# Patient Record
Sex: Male | Born: 1985 | Race: Black or African American | Hispanic: No | Marital: Single | Smoking: Never smoker
Health system: Southern US, Community
[De-identification: ages and names within clinical notes are randomized; demographics above are authoritative.]

## PROBLEM LIST (undated history)

## (undated) DIAGNOSIS — J45909 Unspecified asthma, uncomplicated: Secondary | ICD-10-CM

---
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
## 2006-12-18 ENCOUNTER — Emergency Department (HOSPITAL_COMMUNITY): Admission: EM | Admit: 2006-12-18 | Discharge: 2006-12-18 | Payer: Self-pay | Admitting: Family Medicine

## 2007-04-16 ENCOUNTER — Emergency Department (HOSPITAL_COMMUNITY): Admission: EM | Admit: 2007-04-16 | Discharge: 2007-04-16 | Payer: Self-pay | Admitting: Emergency Medicine

## 2009-06-09 ENCOUNTER — Emergency Department (HOSPITAL_COMMUNITY): Admission: EM | Admit: 2009-06-09 | Discharge: 2009-06-09 | Payer: Self-pay | Admitting: Family Medicine

## 2009-10-28 ENCOUNTER — Emergency Department (HOSPITAL_COMMUNITY): Admission: EM | Admit: 2009-10-28 | Discharge: 2009-10-29 | Payer: Self-pay | Admitting: Emergency Medicine

## 2009-11-11 ENCOUNTER — Emergency Department (HOSPITAL_COMMUNITY): Admission: EM | Admit: 2009-11-11 | Discharge: 2009-11-11 | Payer: Self-pay | Admitting: Family Medicine

## 2010-01-26 ENCOUNTER — Emergency Department (HOSPITAL_COMMUNITY): Admission: EM | Admit: 2010-01-26 | Discharge: 2010-01-26 | Payer: Self-pay | Admitting: Emergency Medicine

## 2010-06-03 ENCOUNTER — Emergency Department (HOSPITAL_COMMUNITY): Admission: EM | Admit: 2010-06-03 | Discharge: 2010-06-03 | Payer: Self-pay | Admitting: Family Medicine

## 2010-06-20 ENCOUNTER — Emergency Department (HOSPITAL_COMMUNITY): Admission: EM | Admit: 2010-06-20 | Discharge: 2010-06-20 | Payer: Self-pay | Admitting: Emergency Medicine

## 2011-05-05 LAB — DIFFERENTIAL
Basophils Absolute: 0
Basophils Relative: 0
Eosinophils Absolute: 0.1
Eosinophils Relative: 2
Lymphs Abs: 1.6
Monocytes Absolute: 0.3
Monocytes Relative: 4
Neutro Abs: 5
Neutrophils Relative %: 70

## 2011-05-05 LAB — CBC
Platelets: 226
RDW: 13.6

## 2011-05-05 LAB — ABO/RH: ABO/RH(D): O NEG

## 2011-05-05 LAB — POCT I-STAT CREATININE: Creatinine, Ser: 1.3

## 2013-11-13 ENCOUNTER — Encounter (HOSPITAL_COMMUNITY): Payer: Self-pay | Admitting: Emergency Medicine

## 2013-11-13 ENCOUNTER — Emergency Department (INDEPENDENT_AMBULATORY_CARE_PROVIDER_SITE_OTHER)
Admission: EM | Admit: 2013-11-13 | Discharge: 2013-11-13 | Disposition: A | Discharge status: Home / Self Care | Payer: Self-pay | Source: Home / Self Care | Attending: Family Medicine | Admitting: Family Medicine

## 2013-11-13 DIAGNOSIS — J40 Bronchitis, not specified as acute or chronic: Secondary | ICD-10-CM

## 2013-11-13 MED ORDER — IPRATROPIUM-ALBUTEROL 0.5-2.5 (3) MG/3ML IN SOLN
3.0000 mL | Freq: Once | RESPIRATORY_TRACT | Status: AC
  Administered 2013-11-13: 3 mL via RESPIRATORY_TRACT

## 2013-11-13 MED ORDER — ALBUTEROL SULFATE HFA 108 (90 BASE) MCG/ACT IN AERS: 2.0000 | INHALATION_SPRAY | Freq: Four times a day (QID) | RESPIRATORY_TRACT | Status: DC | PRN

## 2013-11-13 MED ORDER — FLUTICASONE PROPIONATE 50 MCG/ACT NA SUSP: 2.0000 | Freq: Every day | NASAL | Status: AC

## 2013-11-13 MED ORDER — IPRATROPIUM-ALBUTEROL 0.5-2.5 (3) MG/3ML IN SOLN
RESPIRATORY_TRACT | Status: AC
  Filled 2013-11-13: qty 3

## 2013-11-13 MED ORDER — PREDNISONE 10 MG PO TABS: 30.0000 mg | ORAL_TABLET | Freq: Every day | ORAL | Status: DC

## 2013-11-13 NOTE — Discharge Instructions (Signed)
Thank you for coming in today. °Call or go to the emergency room if you get worse, have trouble breathing, have chest pains, or palpitations.  ° °Bronchitis °Bronchitis is inflammation of the airways that extend from the windpipe into the lungs (bronchi). The inflammation often causes mucus to develop, which leads to a cough. If the inflammation becomes severe, it may cause shortness of breath. °CAUSES  °Bronchitis may be caused by:  °· Viral infections.   °· Bacteria.   °· Cigarette smoke.   °· Allergens, pollutants, and other irritants.   °SIGNS AND SYMPTOMS  °The most common symptom of bronchitis is a frequent cough that produces mucus. Other symptoms include: °· Fever.   °· Body aches.   °· Chest congestion.   °· Chills.   °· Shortness of breath.   °· Sore throat.   °DIAGNOSIS  °Bronchitis is usually diagnosed through a medical history and physical exam. Tests, such as chest X-rays, are sometimes done to rule out other conditions.  °TREATMENT  °You may need to avoid contact with whatever caused the problem (smoking, for example). Medicines are sometimes needed. These may include: °· Antibiotics. These may be prescribed if the condition is caused by bacteria. °· Cough suppressants. These may be prescribed for relief of cough symptoms.   °· Inhaled medicines. These may be prescribed to help open your airways and make it easier for you to breathe.   °· Steroid medicines. These may be prescribed for those with recurrent (chronic) bronchitis. °HOME CARE INSTRUCTIONS °· Get plenty of rest.   °· Drink enough fluids to keep your urine clear or pale yellow (unless you have a medical condition that requires fluid restriction). Increasing fluids may help thin your secretions and will prevent dehydration.   °· Only take over-the-counter or prescription medicines as directed by your health care provider. °· Only take antibiotics as directed. Make sure you finish them even if you start to feel better. °· Avoid secondhand  smoke, irritating chemicals, and strong fumes. These will make bronchitis worse. If you are a smoker, quit smoking. Consider using nicotine gum or skin patches to help control withdrawal symptoms. Quitting smoking will help your lungs heal faster.   °· Put a cool-mist humidifier in your bedroom at night to moisten the air. This may help loosen mucus. Change the water in the humidifier daily. You can also run the hot water in your shower and sit in the bathroom with the door closed for 5 10 minutes.   °· Follow up with your health care provider as directed.   °· Wash your hands frequently to avoid catching bronchitis again or spreading an infection to others.   °SEEK MEDICAL CARE IF: °Your symptoms do not improve after 1 week of treatment.  °SEEK IMMEDIATE MEDICAL CARE IF: °· Your fever increases. °· You have chills.   °· You have chest pain.   °· You have worsening shortness of breath.   °· You have bloody sputum. °· You faint.   °· You have lightheadedness. °· You have a severe headache.   °· You vomit repeatedly. °MAKE SURE YOU:  °· Understand these instructions. °· Will watch your condition. °· Will get help right away if you are not doing well or get worse. °Document Released: 07/11/2005 Document Revised: 05/01/2013 Document Reviewed: 03/05/2013 °Exit[employer]® Patient Information ©2014 [employer]. ° °

## 2013-11-13 NOTE — ED Notes (Signed)
Pt c/o cold sx onset 2 days Sx include: runny nose, chest congestion, HA, productive cough, SOB Hx of asthma Has tried Mucinex and used his albuterol w/no relief.

## 2013-11-13 NOTE — ED Provider Notes (Signed)
George BucyMichael J Mcaulay is a 28 y.o. male who presents to Urgent Care today for cough congestion runny nose associated with mild shortness of breath. Symptoms present for 2 days. Patient has tried Mucinex which has helped a bit. Albuterol has not helped much. No fevers chills nausea vomiting or diarrhea.   History reviewed. No pertinent past medical history. History  Substance Use Topics  . Smoking status: Never Smoker   . Smokeless tobacco: Not on file  . Alcohol Use: No   ROS as above Medications: No current facility-administered medications for this encounter.   Current Outpatient Prescriptions  Medication Sig Dispense Refill  . albuterol (PROVENTIL HFA;VENTOLIN HFA) 108 (90 BASE) MCG/ACT inhaler Inhale 2 puffs into the lungs every 6 (six) hours as needed for wheezing or shortness of breath.  1 Inhaler  2  . fluticasone (FLONASE) 50 MCG/ACT nasal spray Place 2 sprays into both nostrils daily.  16 g  2  . predniSONE (DELTASONE) 10 MG tablet Take 3 tablets (30 mg total) by mouth daily.  15 tablet  0    Exam:  BP 128/79  Pulse 58  Temp(Src) 99 F (37.2 C) (Oral)  Resp 18  SpO2 98% Gen: Well NAD HEENT: EOMI,  MMM posterior pharynx cobblestoning. Normal tympanic membranes bilaterally. Lungs: Normal work of breathing. Wheezing present Heart: RRR no MRG Abd: NABS, Soft. NT, ND Exts: Brisk capillary refill, warm and well perfused.   Patient was given a DuoNeb nebulizer treatment and felt much better.  No results found for this or any previous visit (from the past 24 hour(s)). No results found.  Assessment and Plan: 28 y.o. male with bronchitis. Likely due to allergies. Plan to treat with prednisone, albuterol and oral antihistamines.  Discussed warning signs or symptoms. Please see discharge instructions. Patient expresses understanding.    Rodolph BongEvan S Aviv Rota, MD 11/13/13 [phone]

## 2014-11-28 ENCOUNTER — Emergency Department (HOSPITAL_COMMUNITY)
Admission: EM | Admit: 2014-11-28 | Discharge: 2014-11-28 | Disposition: A | Discharge status: Home / Self Care | Payer: No Typology Code available for payment source | Attending: Emergency Medicine | Admitting: Emergency Medicine

## 2014-11-28 ENCOUNTER — Encounter (HOSPITAL_COMMUNITY): Payer: Self-pay | Admitting: Emergency Medicine

## 2014-11-28 ENCOUNTER — Emergency Department (HOSPITAL_COMMUNITY): Payer: No Typology Code available for payment source

## 2014-11-28 DIAGNOSIS — S161XXA Strain of muscle, fascia and tendon at neck level, initial encounter: Secondary | ICD-10-CM | POA: Diagnosis not present

## 2014-11-28 DIAGNOSIS — Z7951 Long term (current) use of inhaled steroids: Secondary | ICD-10-CM | POA: Diagnosis not present

## 2014-11-28 DIAGNOSIS — S8001XA Contusion of right knee, initial encounter: Secondary | ICD-10-CM | POA: Insufficient documentation

## 2014-11-28 DIAGNOSIS — Y998 Other external cause status: Secondary | ICD-10-CM | POA: Insufficient documentation

## 2014-11-28 DIAGNOSIS — S40011A Contusion of right shoulder, initial encounter: Secondary | ICD-10-CM | POA: Insufficient documentation

## 2014-11-28 DIAGNOSIS — S3992XA Unspecified injury of lower back, initial encounter: Secondary | ICD-10-CM | POA: Insufficient documentation

## 2014-11-28 DIAGNOSIS — S199XXA Unspecified injury of neck, initial encounter: Secondary | ICD-10-CM | POA: Diagnosis present

## 2014-11-28 DIAGNOSIS — J45909 Unspecified asthma, uncomplicated: Secondary | ICD-10-CM | POA: Diagnosis not present

## 2014-11-28 DIAGNOSIS — Y92410 Unspecified street and highway as the place of occurrence of the external cause: Secondary | ICD-10-CM | POA: Diagnosis not present

## 2014-11-28 DIAGNOSIS — Z79899 Other long term (current) drug therapy: Secondary | ICD-10-CM | POA: Diagnosis not present

## 2014-11-28 DIAGNOSIS — Y9389 Activity, other specified: Secondary | ICD-10-CM | POA: Diagnosis not present

## 2014-11-28 HISTORY — DX: Unspecified asthma, uncomplicated: J45.909

## 2014-11-28 MED ORDER — OXYCODONE-ACETAMINOPHEN 5-325 MG PO TABS
2.0000 | ORAL_TABLET | Freq: Once | ORAL | Status: AC
  Administered 2014-11-28: 2 via ORAL
  Filled 2014-11-28: qty 2
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
## 2014-11-28 NOTE — ED Notes (Signed)
Patient on EMS stretcher moving head from side to side while adjusting C-collar-reinforcement to patient to keep head and neck still
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
## 2014-11-28 NOTE — Discharge Instructions (Signed)
Cervical Sprain °A cervical sprain is when the tissues (ligaments) that hold the neck bones in place stretch or tear. °HOME CARE  °· Put ice on the injured area. °¨ Put ice in a plastic bag. °¨ Place a towel between your skin and the bag. °¨ Leave the ice on for 15-20 minutes, 3-4 times a day. °· You may have been given a collar to wear. This collar keeps your neck from moving while you heal. °¨ Do not take the collar off unless told by your doctor. °¨ If you have long hair, keep it outside of the collar. °¨ Ask your doctor before changing the position of your collar. You may need to change its position over time to make it more comfortable. °¨ If you are allowed to take off the collar for cleaning or bathing, follow your doctor's instructions on how to do it safely. °¨ Keep your collar clean by wiping it with mild soap and water. Dry it completely. If the collar has removable pads, remove them every 1-2 days to hand wash them with soap and water. Allow them to air dry. They should be dry before you wear them in the collar. °¨ Do not drive while wearing the collar. °· Only take medicine as told by your doctor. °· Keep all doctor visits as told. °· Keep all physical therapy visits as told. °· Adjust your work station so that you have good posture while you work. °· Avoid positions and activities that make your problems worse. °· Warm up and stretch before being active. °GET HELP IF: °· Your pain is not controlled with medicine. °· You cannot take less pain medicine over time as planned. °· Your activity level does not improve as expected. °GET HELP RIGHT AWAY IF:  °· You are bleeding. °· Your stomach is upset. °· You have an allergic reaction to your medicine. °· You develop new problems that you cannot explain. °· You lose feeling (become numb) or you cannot move any part of your body (paralysis). °· You have tingling or weakness in any part of your body. °· Your symptoms get worse. Symptoms include: °· Pain,  soreness, stiffness, puffiness (swelling), or a burning feeling in your neck. °· Pain when your neck is touched. °· Shoulder or upper back pain. °· Limited ability to move your neck. °· Headache. °· Dizziness. °· Your hands or arms feel week, lose feeling, or tingle. °· Muscle spasms. °· Difficulty swallowing or chewing. °MAKE SURE YOU:  °· Understand these instructions. °· Will watch your condition. °· Will get help right away if you are not doing well or get worse. °Document Released: 12/28/2007 Document Revised: 03/13/2013 Document Reviewed: 01/16/2013 °[employer]® Patient Information ©2015 [employer]. This information is not intended to replace advice given to you by your health care provider. Make sure you discuss any questions you have with your health care provider. ° °Motor Vehicle Collision °After a car crash (motor vehicle collision), it is normal to have bruises and sore muscles. The first 24 hours usually feel the worst. After that, you will likely start to feel better each day. °HOME CARE °· Put ice on the injured area. °¨ Put ice in a plastic bag. °¨ Place a towel between your skin and the bag. °¨ Leave the ice on for 15-20 minutes, 03-04 times a day. °· Drink enough fluids to keep your pee (urine) clear or pale yellow. °· Do not drink alcohol. °· Take a warm shower or bath 1 or 2   times a day. This helps your sore muscles. °· Return to activities as told by your doctor. Be careful when lifting. Lifting can make neck or back pain worse. °· Only take medicine as told by your doctor. Do not use aspirin. °GET HELP RIGHT AWAY IF:  °· Your arms or legs tingle, feel weak, or lose feeling (numbness). °· You have headaches that do not get better with medicine. °· You have neck pain, especially in the middle of the back of your neck. °· You cannot control when you pee (urinate) or poop (bowel movement). °· Pain is getting worse in any part of your body. °· You are short of breath, dizzy, or pass out  (faint). °· You have chest pain. °· You feel sick to your stomach (nauseous), throw up (vomit), or sweat. °· You have belly (abdominal) pain that gets worse. °· There is blood in your pee, poop, or throw up. °· You have pain in your shoulder (shoulder strap areas). °· Your problems are getting worse. °MAKE SURE YOU:  °· Understand these instructions. °· Will watch your condition. °· Will get help right away if you are not doing well or get worse. °Document Released: 12/28/2007 Document Revised: 10/03/2011 Document Reviewed: 12/08/2010 °[employer]® Patient Information ©2015 [employer]. This information is not intended to replace advice given to you by your health care provider. Make sure you discuss any questions you have with your health care provider. ° °

## 2014-11-28 NOTE — ED Notes (Signed)
Pt moved to Recess A for assessment per MD request
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
## 2017-02-23 ENCOUNTER — Encounter (HOSPITAL_COMMUNITY): Payer: Self-pay | Admitting: Emergency Medicine

## 2017-02-23 ENCOUNTER — Emergency Department (HOSPITAL_COMMUNITY)
Admission: EM | Admit: 2017-02-23 | Discharge: 2017-02-24 | Disposition: A | Discharge status: Home / Self Care | Payer: Self-pay | Attending: Emergency Medicine | Admitting: Emergency Medicine

## 2017-02-23 DIAGNOSIS — J45909 Unspecified asthma, uncomplicated: Secondary | ICD-10-CM | POA: Insufficient documentation

## 2017-02-23 DIAGNOSIS — N200 Calculus of kidney: Secondary | ICD-10-CM | POA: Insufficient documentation

## 2017-02-23 DIAGNOSIS — R52 Pain, unspecified: Secondary | ICD-10-CM

## 2017-02-23 NOTE — ED Notes (Signed)
Bed: WA16 Expected date:  Expected time:  Means of arrival:  Comments: abd pain 

## 2017-02-23 NOTE — ED Triage Notes (Signed)
Pt from home via EMS.  Reports eating several types of fast food and cheese for last several days.  Reports RUQ/RLQ sharp abd pain after eating [employer] for dinner.  A&Ox4.  Denies D/C or vomiting, some nausea present.  Pt shouting out and rolling around in bed. VS: 142/78, 60 bpm, 174 CBG, 20 RR

## 2017-02-24 ENCOUNTER — Emergency Department (HOSPITAL_COMMUNITY): Payer: Self-pay

## 2017-02-24 ENCOUNTER — Encounter (HOSPITAL_COMMUNITY): Payer: Self-pay

## 2017-02-24 LAB — COMPREHENSIVE METABOLIC PANEL
ALK PHOS: 42 U/L (ref 38–126)
ALT: 23 U/L (ref 17–63)
AST: 23 U/L (ref 15–41)
Albumin: 4.4 g/dL (ref 3.5–5.0)
Anion gap: 9 (ref 5–15)
BILIRUBIN TOTAL: 0.6 mg/dL (ref 0.3–1.2)
BUN: 17 mg/dL (ref 6–20)
CALCIUM: 9.1 mg/dL (ref 8.9–10.3)
CHLORIDE: 106 mmol/L (ref 101–111)
CO2: 27 mmol/L (ref 22–32)
CREATININE: 1.27 mg/dL — AB (ref 0.61–1.24)
Glucose, Bld: 152 mg/dL — ABNORMAL HIGH (ref 65–99)
Potassium: 3.3 mmol/L — ABNORMAL LOW (ref 3.5–5.1)
Sodium: 142 mmol/L (ref 135–145)
TOTAL PROTEIN: 7.2 g/dL (ref 6.5–8.1)

## 2017-02-24 LAB — CBC
HCT: 37 % — ABNORMAL LOW (ref 39.0–52.0)
Hemoglobin: 12.5 g/dL — ABNORMAL LOW (ref 13.0–17.0)
MCH: 29.3 pg (ref 26.0–34.0)
MCHC: 33.8 g/dL (ref 30.0–36.0)
MCV: 86.7 fL (ref 78.0–100.0)
Platelets: 199 10*3/uL (ref 150–400)
RBC: 4.27 MIL/uL (ref 4.22–5.81)
RDW: 14.2 % (ref 11.5–15.5)
WBC: 11.5 10*3/uL — AB (ref 4.0–10.5)

## 2017-02-24 LAB — LIPASE, BLOOD: Lipase: 15 U/L (ref 11–51)

## 2017-02-24 LAB — URINALYSIS, ROUTINE W REFLEX MICROSCOPIC
BILIRUBIN URINE: NEGATIVE
GLUCOSE, UA: NEGATIVE mg/dL
HGB URINE DIPSTICK: NEGATIVE
KETONES UR: 5 mg/dL — AB
Leukocytes, UA: NEGATIVE
Nitrite: NEGATIVE
PH: 5 (ref 5.0–8.0)
Protein, ur: NEGATIVE mg/dL
SPECIFIC GRAVITY, URINE: 1.027 (ref 1.005–1.030)

## 2017-02-24 MED ORDER — GI COCKTAIL ~~LOC~~
30.0000 mL | Freq: Once | ORAL | Status: AC
  Administered 2017-02-24: 30 mL via ORAL
  Filled 2017-02-24: qty 30

## 2017-02-24 MED ORDER — ONDANSETRON HCL 4 MG PO TABS: 4.0000 mg | ORAL_TABLET | Freq: Four times a day (QID) | ORAL | 0 refills | Status: DC

## 2017-02-24 MED ORDER — METOCLOPRAMIDE HCL 5 MG/ML IJ SOLN
10.0000 mg | Freq: Once | INTRAMUSCULAR | Status: AC
  Administered 2017-02-24: 10 mg via INTRAVENOUS
  Filled 2017-02-24: qty 2

## 2017-02-24 MED ORDER — IOPAMIDOL (ISOVUE-300) INJECTION 61%
100.0000 mL | Freq: Once | INTRAVENOUS | Status: AC | PRN
  Administered 2017-02-24: 100 mL via INTRAVENOUS

## 2017-02-24 MED ORDER — HYDROCODONE-ACETAMINOPHEN 5-325 MG PO TABS: 1.0000 | ORAL_TABLET | ORAL | 0 refills | Status: DC | PRN

## 2017-02-24 MED ORDER — POTASSIUM CHLORIDE CRYS ER 20 MEQ PO TBCR
40.0000 meq | EXTENDED_RELEASE_TABLET | Freq: Once | ORAL | Status: AC
Start: 2017-02-24 — End: 2017-02-24
  Administered 2017-02-24: 40 meq via ORAL
  Filled 2017-02-24: qty 2

## 2017-02-24 MED ORDER — SODIUM CHLORIDE 0.9 % IV BOLUS (SEPSIS)
1000.0000 mL | Freq: Once | INTRAVENOUS | Status: AC
  Administered 2017-02-24: 1000 mL via INTRAVENOUS

## 2017-02-24 MED ORDER — DICYCLOMINE HCL 10 MG/ML IM SOLN
20.0000 mg | Freq: Once | INTRAMUSCULAR | Status: AC
  Administered 2017-02-24: 20 mg via INTRAMUSCULAR
  Filled 2017-02-24: qty 2

## 2017-02-24 MED ORDER — KETOROLAC TROMETHAMINE 30 MG/ML IJ SOLN
15.0000 mg | Freq: Once | INTRAMUSCULAR | Status: AC
  Administered 2017-02-24: 15 mg via INTRAVENOUS
  Filled 2017-02-24: qty 1

## 2017-02-24 MED ORDER — TAMSULOSIN HCL 0.4 MG PO CAPS: 0.4000 mg | ORAL_CAPSULE | Freq: Every day | ORAL | 0 refills | Status: DC

## 2017-02-24 MED ORDER — MORPHINE SULFATE (PF) 4 MG/ML IV SOLN
6.0000 mg | Freq: Once | INTRAVENOUS | Status: AC
  Administered 2017-02-24: 6 mg via INTRAVENOUS
  Filled 2017-02-24: qty 2

## 2017-02-24 MED ORDER — IOPAMIDOL (ISOVUE-300) INJECTION 61%
INTRAVENOUS | Status: AC
  Filled 2017-02-24: qty 100

## 2017-02-24 MED ORDER — MORPHINE SULFATE (PF) 4 MG/ML IV SOLN
4.0000 mg | Freq: Once | INTRAVENOUS | Status: DC
  Filled 2017-02-24: qty 1

## 2017-02-24 NOTE — ED Provider Notes (Signed)
[facility] Provider Note   CSN: [id number] Arrival date & time: 02/23/17  2336     History   Chief Complaint Chief Complaint  Patient presents with  . Abdominal Pain    HPI George Coleman is a 31 y.o. male.  HPI 31 year old African-American male with no significant past medical history presents to the emergency department today with vague complaints of right sided abdominal pain, nausea, emesis. Patient states that his pain started this afternoon after eating [employer], spicy [place] food, spicy wings, soda today. No history of same. Reports several episodes of nonbloody nonbilious emesis. Denies any diarrhea. States the pain is constant and describes it as a cramping pain. Rates his pain a 10 out of 10. Patient has not tried anything for his symptoms prior to arrival. Nothing makes better or worse. He denies any fever, chills, urinary symptoms, change in bowel habits, melena, hematochezia, chest pain, shortness of breath. Past Medical History:  Diagnosis Date  . Asthma     There are no active problems to display for this patient.   History reviewed. No pertinent surgical history.     Home Medications    Prior to Admission medications   Medication Sig Start Date End Date Taking? Authorizing Provider  albuterol (PROVENTIL HFA;VENTOLIN HFA) 108 (90 BASE) MCG/ACT inhaler Inhale 2 puffs into the lungs every 6 (six) hours as needed for wheezing or shortness of breath. 11/13/13   Rodolph Bong, MD  fluticasone (FLONASE) 50 MCG/ACT nasal spray Place 2 sprays into both nostrils daily. Patient taking differently: Place 2 sprays into both nostrils daily as needed for allergies.  11/13/13   Rodolph Bong, MD  HYDROcodone-acetaminophen (NORCO/VICODIN) 5-325 MG tablet Take 1-2 tablets by mouth every 4 (four) hours as needed. 02/24/17   Demetrios Loll T, PA-C  ondansetron (ZOFRAN) 4 MG tablet Take 1 tablet (4 mg total) by mouth every 6 (six) hours. 02/24/17   Rise Mu, PA-C    tamsulosin (FLOMAX) 0.4 MG CAPS capsule Take 1 capsule (0.4 mg total) by mouth daily. 02/24/17   Rise Mu, PA-C    Family History No family history on file.  Social History Social History  Substance Use Topics  . Smoking status: Never Smoker  . Smokeless tobacco: Not on file  . Alcohol use No     Allergies   Patient has no known allergies.   Review of Systems Review of Systems  Constitutional: Negative for chills and fever.  HENT: Negative for congestion and sore throat.   Eyes: Negative for visual disturbance.  Respiratory: Negative for cough and shortness of breath.   Cardiovascular: Negative for chest pain.  Gastrointestinal: Positive for abdominal pain, nausea and vomiting. Negative for diarrhea.  Genitourinary: Negative for dysuria, flank pain, frequency, hematuria, scrotal swelling, testicular pain and urgency.  Musculoskeletal: Negative for arthralgias and myalgias.  Skin: Negative for rash.  Neurological: Negative for dizziness, syncope, weakness, light-headedness, numbness and headaches.  Psychiatric/Behavioral: Negative for sleep disturbance. The patient is not nervous/anxious.      Physical Exam Updated Vital Signs BP (!) 133/91   Pulse 87   Temp (!) 97.5 F (36.4 C) (Oral)   Resp 18   SpO2 100%   Physical Exam  Constitutional: He is oriented to person, place, and time. He appears well-developed and well-nourished.  Non-toxic appearance. He appears distressed.  Patient appears very uncomfortable due to pain and pacing in the room.  HENT:  Head: Normocephalic and atraumatic.  Mouth/Throat: Oropharynx is clear  and moist.  Eyes: Pupils are equal, round, and reactive to light. Conjunctivae are normal. Right eye exhibits no discharge. Left eye exhibits no discharge.  Neck: Normal range of motion. Neck supple.  Cardiovascular: Normal rate, regular rhythm, normal heart sounds and intact distal pulses.  Exam reveals no gallop and no friction rub.    No murmur heard. Pulmonary/Chest: Effort normal and breath sounds normal. No respiratory distress. He has no wheezes. He has no rales. He exhibits no tenderness.  Abdominal: Soft. Bowel sounds are normal. He exhibits no distension. There is generalized tenderness and tenderness in the right upper quadrant and epigastric area. There is no rigidity, no rebound, no guarding, no CVA tenderness, no tenderness at McBurney's point and negative Mares's sign.  Musculoskeletal: Normal range of motion. He exhibits no tenderness.  Lymphadenopathy:    He has no cervical adenopathy.  Neurological: He is alert and oriented to person, place, and time.  Skin: Skin is warm and dry. Capillary refill takes less than 2 seconds. No rash noted.  Psychiatric: His behavior is normal. Judgment and thought content normal.  Nursing note and vitals reviewed.    ED Treatments / Results  Labs (all labs ordered are listed, but only abnormal results are displayed) Labs Reviewed  COMPREHENSIVE METABOLIC PANEL - Abnormal; Notable for the following:       Result Value   Potassium 3.3 (*)    Glucose, Bld 152 (*)    Creatinine, Ser 1.27 (*)    All other components within normal limits  CBC - Abnormal; Notable for the following:    WBC 11.5 (*)    Hemoglobin 12.5 (*)    HCT 37.0 (*)    All other components within normal limits  URINALYSIS, ROUTINE W REFLEX MICROSCOPIC - Abnormal; Notable for the following:    Ketones, ur 5 (*)    All other components within normal limits  LIPASE, BLOOD    EKG  EKG Interpretation None       Radiology Ct Abdomen Pelvis W Contrast  Result Date: 02/24/2017 CLINICAL DATA:  Initial evaluation for acute right upper and lower quadrant abdominal pain. EXAM: CT ABDOMEN AND PELVIS WITH CONTRAST TECHNIQUE: Multidetector CT imaging of the abdomen and pelvis was performed using the standard protocol following bolus administration of intravenous contrast. CONTRAST:  100 cc of Isovue-300.  COMPARISON:  Prior ultrasound from earlier the same day. FINDINGS: Lower chest: Visualized lung bases are clear. Hepatobiliary: Liver demonstrates a normal contrast enhanced appearance. Focal fat deposition noted adjacent to the fissure for ligamentum teres. Gallbladder within normal limits. No biliary dilatation. Pancreas: Pancreas within normal limits. Spleen: Spleen within normal limits. Adrenals/Urinary Tract: Adrenal glands are normal. Slightly delayed nephrogram within the right kidney with mild right hydroureteronephrosis. There is an obstructive 3 mm stone positioned at the right UVJ (series 2, image 74). No other calculi seen along the course of the right ureter. Additional 3 mm nonobstructive stone present within the lower pole the right kidney. Milder right perinephric and periureteral fat stranding. On the left, there is moderate left hydronephrosis to the level of the left UPJ. Left ureter is decompressed distally. No obstructive stone identified. Finding may reflect sequelae of underlying stricture. Underlying mass not entirely excluded. 8 mm nonobstructive stone present within the lower pole left kidney. No focal enhancing renal mass. Bladder largely decompressed without acute additional abnormality. Stomach/Bowel: Stomach within normal limits. No evidence for bowel obstruction. Appendix normal. No acute inflammatory changes seen about the bowels. Vascular/Lymphatic: Normal intravascular  enhancement seen throughout the intra-abdominal aorta and its branch vessels. No adenopathy. Reproductive: Prostate within normal limits. Other: No free air or fluid. Musculoskeletal: No acute osseous abnormality. No worrisome lytic or blastic osseous lesions. IMPRESSION: 1. 3 mm obstructive stone at the right UVJ with secondary mild right hydroureteronephrosis. 2. Additional bilateral nonobstructive nephrolithiasis as above. 3. Moderate left hydronephrosis to the level of the left UVJ. No obstructive stone  identified. Underlying stricture suspected, although obstructive mass lesion not entirely excluded. Nonemergent urologic referral suggested. 4. No other acute intra-abdominal or pelvic process. Electronically Signed   By: Rise MuBenjamin  McClintock M.D.   On: 02/24/2017 02:35   [place] Abdomen Limited Ruq  Result Date: 02/24/2017 CLINICAL DATA:  31 year old male with abdominal pain. EXAM: ULTRASOUND ABDOMEN LIMITED RIGHT UPPER QUADRANT COMPARISON:  None. FINDINGS: Gallbladder: No gallstones or wall thickening visualized. No sonographic Stallman sign noted by sonographer. Common bile duct: Diameter: 2 mm Liver: No focal lesion identified. Within normal limits in parenchymal echogenicity. IMPRESSION: Unremarkable right upper quadrant ultrasound. Electronically Signed   By: Elgie CollardArash  Radparvar M.D.   On: 02/24/2017 01:18    Procedures Procedures (including critical care time)  Medications Ordered in ED Medications  iopamidol (ISOVUE-300) 61 % injection (not administered)  metoCLOPramide (REGLAN) injection 10 mg (10 mg Intravenous Given 02/24/17 0024)  sodium chloride 0.9 % bolus 1,000 mL (0 mLs Intravenous Stopped 02/24/17 0258)  gi cocktail (Maalox,Lidocaine,Donnatal) (30 mLs Oral Given 02/24/17 0024)  dicyclomine (BENTYL) injection 20 mg (20 mg Intramuscular Given 02/24/17 0024)  iopamidol (ISOVUE-300) 61 % injection 100 mL (100 mLs Intravenous Contrast Given 02/24/17 0211)  ketorolac (TORADOL) 30 MG/ML injection 15 mg (15 mg Intravenous Given 02/24/17 0257)  morphine 4 MG/ML injection 6 mg (6 mg Intravenous Given 02/24/17 0258)     Initial Impression / Assessment and Plan / ED Course  I have reviewed the triage vital signs and the nursing notes.  Pertinent labs & imaging results that were available during my care of the patient were reviewed by me and considered in my medical decision making (see chart for details).     Patient presents to the ED with a complaint of right-sided abdominal pain after eating food today.  Associated with nausea, emesis. Denies any other associated symptoms of fever, diarrhea. Patient looks uncomfortable due to pain in the room. Vital signs are reassuring. Patient is afebrile, no tachycardia, no hypotension.   Patient does have some right upper quadrant and right lower quadrant abdominal pain to palpation. All other physical exam findings are unremarkable.  Lab results were reviewed. They are reassuring. Mild leukocytosis of 11,000. Hemoglobin is at baseline. Mild hypokalemia 3.3 that was replaced orally. Creatinine is at baseline. UA shows no signs of infection. Lipase is normal. Ultrasound was obtained due to the right upper quadrant pain to rule out any gallbladder abnormalities. This was unremarkable.  Patient remained in pain despite pain medication and to treat likely gastroenteritis. CT scan returned with findings of a 3 mm kidney stone at the UPJ with some mild hydronephrosis of the left side likely due to stricture but cannot rule out obstruction. Patient is afebrile. UA shows no signs of infection. Pain was controlled. Patient able tolerate by mouth fluids without any difficulties and no intractable vomiting. We will discharge home with Ceftin treatment for kidney stone and urology follow-up in outpatient setting.  Pt is hemodynamically stable, in NAD, & able to ambulate in the ED. Evaluation does not show pathology that would require ongoing emergent intervention or  inpatient treatment. I explained the diagnosis to the patient. Pain has been managed & has no complaints prior to dc. Pt is comfortable with above plan and is stable for discharge at this time. All questions were answered prior to disposition. Strict return precautions for f/u to the ED were discussed.    Final Clinical Impressions(s) / ED Diagnoses   Final diagnoses:  Pain  Nephrolithiasis    New Prescriptions New Prescriptions   HYDROCODONE-ACETAMINOPHEN (NORCO/VICODIN) 5-325 MG TABLET    Take 1-2 tablets  by mouth every 4 (four) hours as needed.   ONDANSETRON (ZOFRAN) 4 MG TABLET    Take 1 tablet (4 mg total) by mouth every 6 (six) hours.   TAMSULOSIN (FLOMAX) 0.4 MG CAPS CAPSULE    Take 1 capsule (0.4 mg total) by mouth daily.     Rise MuLeaphart, Kenneth T, PA-C 02/24/17 0353    Nicanor AlconPalumbo, April, MD 02/24/17 [id number]

## 2017-02-24 NOTE — Discharge Instructions (Signed)
Ultrasound shows a kidney stone on the right side. He also have some urine back up and your left kidney. It is important that you follow-up with the urologist and I  have given you a referral. Please take the hydrocodone as needed for pain. Use the Zofran for nausea and vomiting. Take the Flomax once a day. Use the strainer to help determine if he passed the stone. I would also recommend taking ibuprofen as this will help with kidney stone pain.  Return to the ED if you develop any fevers, unable to keep anything down by mouth.

## 2017-11-27 ENCOUNTER — Ambulatory Visit (HOSPITAL_COMMUNITY): Admission: EM | Admit: 2017-11-27 | Discharge: 2017-11-27 | Discharge status: Against Medical Advice | Payer: Self-pay

## 2017-11-28 ENCOUNTER — Ambulatory Visit (HOSPITAL_COMMUNITY)
Admission: EM | Admit: 2017-11-28 | Discharge: 2017-11-28 | Disposition: A | Discharge status: Home / Self Care | Payer: Self-pay | Attending: Family Medicine | Admitting: Family Medicine

## 2017-11-28 ENCOUNTER — Encounter (HOSPITAL_COMMUNITY): Payer: Self-pay | Admitting: Emergency Medicine

## 2017-11-28 DIAGNOSIS — J4520 Mild intermittent asthma, uncomplicated: Secondary | ICD-10-CM

## 2017-11-28 DIAGNOSIS — R0789 Other chest pain: Secondary | ICD-10-CM

## 2017-11-28 DIAGNOSIS — Z760 Encounter for issue of repeat prescription: Secondary | ICD-10-CM

## 2017-11-28 MED ORDER — ALBUTEROL SULFATE HFA 108 (90 BASE) MCG/ACT IN AERS
2.0000 | INHALATION_SPRAY | Freq: Four times a day (QID) | RESPIRATORY_TRACT | 2 refills | Status: AC | PRN
Start: 2017-11-28 — End: ?

## 2017-11-28 NOTE — ED Triage Notes (Signed)
Pt here for asthma inhaler; pt sts out of meds

## 2017-11-28 NOTE — ED Notes (Signed)
Pt called to triage room x2.  No answer.

## 2017-11-28 NOTE — ED Notes (Signed)
Pt called to triage room x1.  No answer. 

## 2017-11-28 NOTE — ED Notes (Signed)
Pt called to triage room x3.  No answer.

## 2017-11-28 NOTE — Discharge Instructions (Addendum)
I have refilled your inhaler

## 2017-11-28 NOTE — ED Provider Notes (Signed)
[facility]    CSN: [id number] Arrival date & time: 11/28/17  1257     History   Chief Complaint Chief Complaint  Patient presents with  . Asthma    HPI George Coleman is a 32 y.o. male history of asthma presenting today for evaluation of his asthma and refill of his inhaler.  Patient states that he is been having chest tightness more frequently with the weather changes and he has ran out of his albuterol inhaler.  Denies any wheezing.  Denies any coughing.  Denies associated congestion or sore throat.  Denies fevers.  HPI  Past Medical History:  Diagnosis Date  . Asthma     There are no active problems to display for this patient.   History reviewed. No pertinent surgical history.     Home Medications    Prior to Admission medications   Medication Sig Start Date End Date Taking? Authorizing Provider  albuterol (PROVENTIL HFA;VENTOLIN HFA) 108 (90 Base) MCG/ACT inhaler Inhale 2 puffs into the lungs every 6 (six) hours as needed for wheezing or shortness of breath. 11/28/17   Cornelious Diven C, PA-C  fluticasone (FLONASE) 50 MCG/ACT nasal spray Place 2 sprays into both nostrils daily. Patient taking differently: Place 2 sprays into both nostrils daily as needed for allergies.  11/13/13   Rodolph Bong, MD  HYDROcodone-acetaminophen (NORCO/VICODIN) 5-325 MG tablet Take 1-2 tablets by mouth every 4 (four) hours as needed. 02/24/17   Demetrios Loll T, PA-C  ondansetron (ZOFRAN) 4 MG tablet Take 1 tablet (4 mg total) by mouth every 6 (six) hours. 02/24/17   Rise Mu, PA-C  tamsulosin (FLOMAX) 0.4 MG CAPS capsule Take 1 capsule (0.4 mg total) by mouth daily. 02/24/17   Rise Mu, PA-C    Family History History reviewed. No pertinent family history.  Social History Social History   Tobacco Use  . Smoking status: Never Smoker  Substance Use Topics  . Alcohol use: No  . Drug use: Not on file     Allergies   Patient has no known  allergies.   Review of Systems Review of Systems  Constitutional: Negative for activity change, appetite change, fatigue and fever.  HENT: Negative for congestion, ear pain, postnasal drip, rhinorrhea, sinus pressure and sore throat.   Eyes: Negative for pain and itching.  Respiratory: Positive for chest tightness. Negative for cough, shortness of breath and wheezing.   Cardiovascular: Negative for chest pain.  Gastrointestinal: Negative for abdominal pain, diarrhea, nausea and vomiting.  Musculoskeletal: Negative for myalgias.  Skin: Negative for rash.  Neurological: Negative for dizziness, light-headedness and headaches.     Physical Exam Triage Vital Signs ED Triage Vitals [11/28/17 1427]  Enc Vitals Group     BP 138/78     Pulse Rate 69     Resp 18     Temp 98.8 F (37.1 C)     Temp Source Oral     SpO2 98 %     Weight      Height      Head Circumference      Peak Flow      Pain Score      Pain Loc      Pain Edu?      Excl. in GC?    No data found.  Updated Vital Signs BP 138/78 (BP Location: Right Arm)   Pulse 69   Temp 98.8 F (37.1 C) (Oral)   Resp 18   SpO2 98%  Visual Acuity Right Eye Distance:   Left Eye Distance:   Bilateral Distance:    Right Eye Near:   Left Eye Near:    Bilateral Near:     Physical Exam  Constitutional: He appears well-developed and well-nourished.  HENT:  Head: Normocephalic and atraumatic.  Mouth/Throat: Oropharynx is clear and moist.  Eyes: Conjunctivae are normal.  Neck: Neck supple.  Cardiovascular: Normal rate and regular rhythm.  No murmur heard. Pulmonary/Chest: Effort normal and breath sounds normal. No respiratory distress.  Breathing comfortably at rest, CTA BL, no wheezing or other adventitious sounds auscultated  Abdominal: Soft. There is no tenderness.  Musculoskeletal: He exhibits no edema.  Neurological: He is alert.  Skin: Skin is warm and dry.  Psychiatric: He has a normal mood and affect.   Nursing note and vitals reviewed.    UC Treatments / Results  Labs (all labs ordered are listed, but only abnormal results are displayed) Labs Reviewed - No data to display  EKG None  Radiology No results found.  Procedures Procedures (including critical care time)  Medications Ordered in UC Medications - No data to display  Initial Impression / Assessment and Plan / UC Course  I have reviewed the triage vital signs and the nursing notes.  Pertinent labs & imaging results that were available during my care of the patient were reviewed by me and considered in my medical decision making (see chart for details).     Albuterol inhaler refilled, does not appear to have an exacerbation at this time. Discussed strict return precautions. Patient verbalized understanding and is agreeable with plan.  Final Clinical Impressions(s) / UC Diagnoses   Final diagnoses:  Medication refill  Mild intermittent asthma, unspecified whether complicated     Discharge Instructions     I have refilled your inhaler   ED Prescriptions    Medication Sig Dispense Auth. Provider   albuterol (PROVENTIL HFA;VENTOLIN HFA) 108 (90 Base) MCG/ACT inhaler Inhale 2 puffs into the lungs every 6 (six) hours as needed for wheezing or shortness of breath. 1 Inhaler Baylin Gamblin C, PA-C     Controlled Substance Prescriptions [place] Controlled Substance Registry consulted? Not Applicable   Lew Dawes, [place] 11/28/17 1503

## 2017-12-28 IMAGING — CT CT ABD-PELV W/ CM
2 of 4 series · 15 of 46 positions shown, 17 images · IV contrast (agent unspecified)
Comparison: Prior ultrasound from earlier the same day.

CLINICAL DATA: Initial evaluation for acute right upper and lower
quadrant abdominal pain.

EXAM:
CT ABDOMEN AND PELVIS WITH CONTRAST
TECHNIQUE: Multidetector CT imaging of the abdomen and pelvis was performed
using the standard protocol following bolus administration of
intravenous contrast.
CONTRAST:  100 cc of Bsovue-600.

[Series 2: abd/pel with · axial · 0.71mm/px · z∈[-625,-230]mm · 12 of 89 slices shown, 14 images]
[im 5/89  soft-tissue]
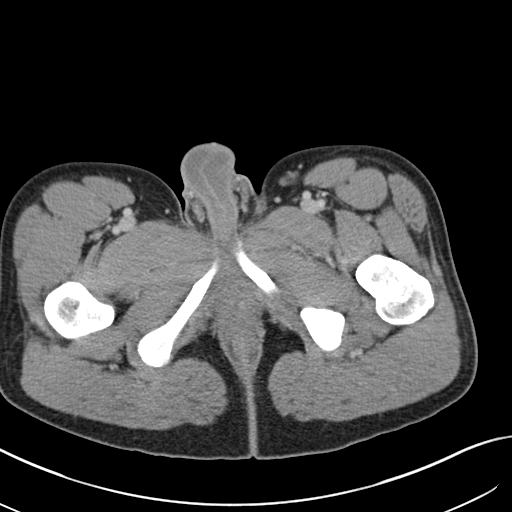
[im 5/89  bone]
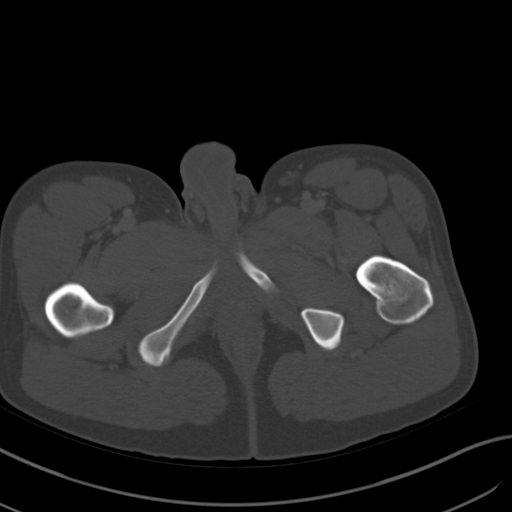
[im 14/89  soft-tissue]
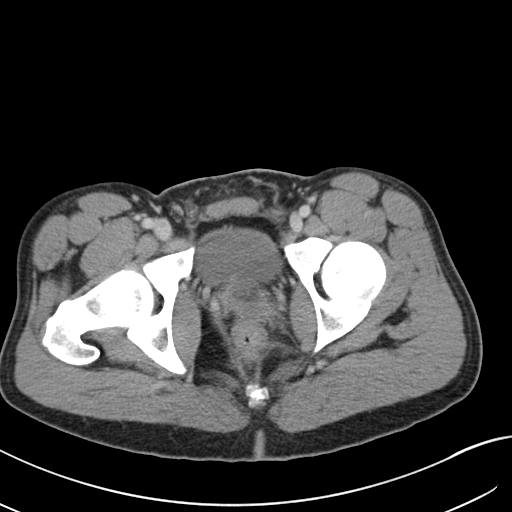
[im 19/89  soft-tissue]
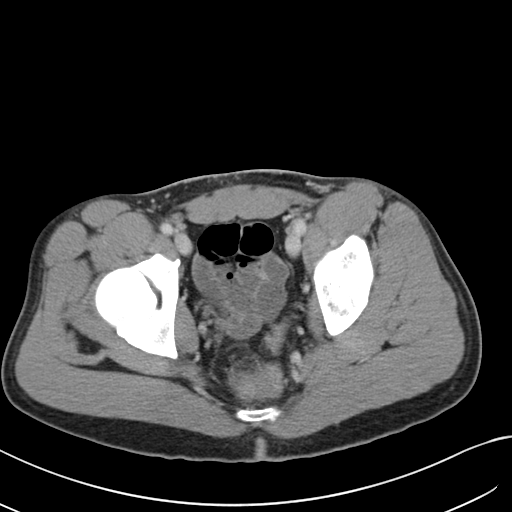
[im 28/89  soft-tissue]
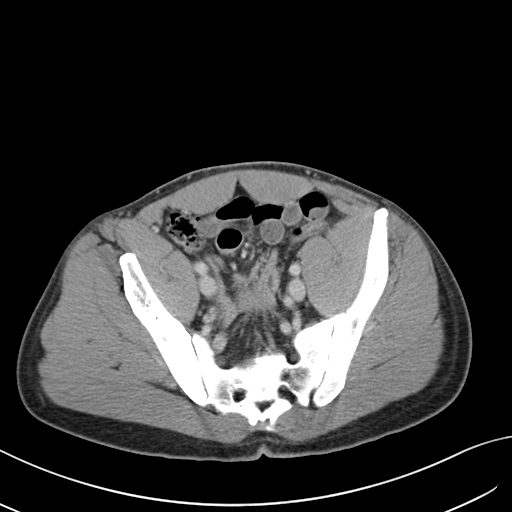
[im 33/89  soft-tissue]
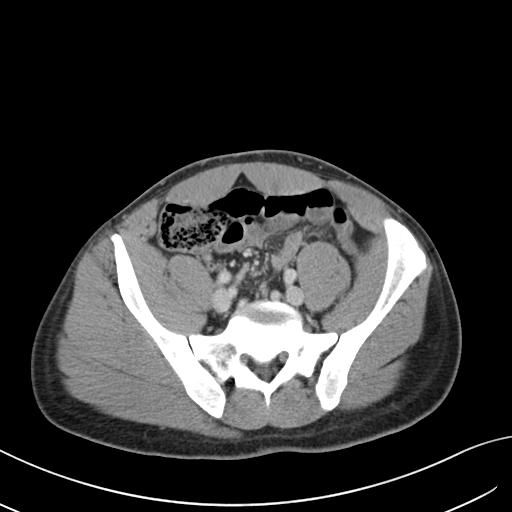
[im 42/89  soft-tissue]
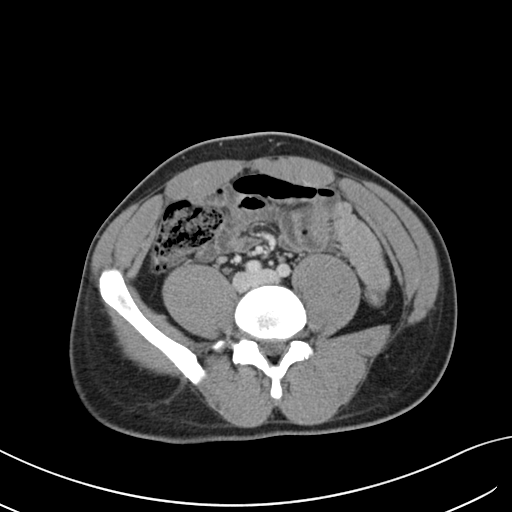
[im 47/89  soft-tissue]
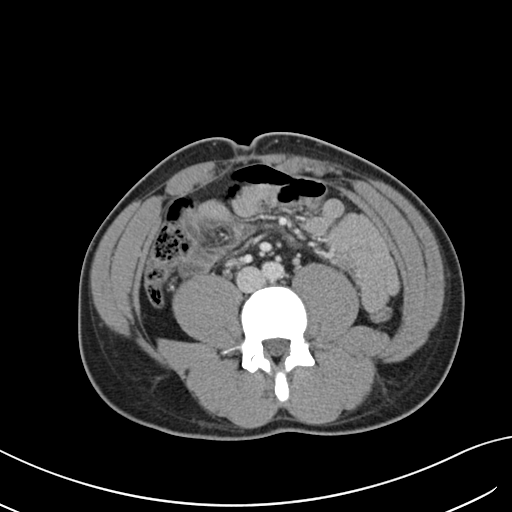
[im 56/89  soft-tissue]
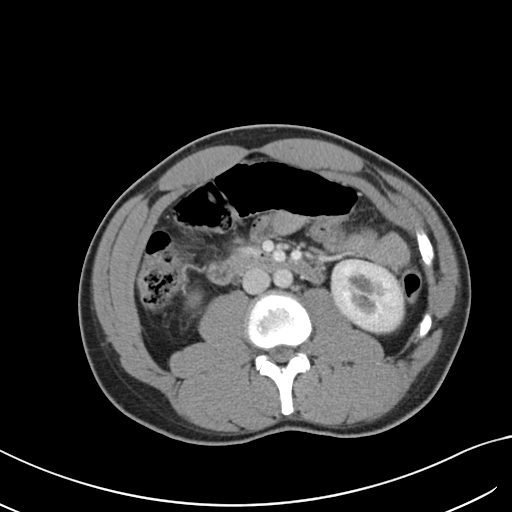
[im 61/89  soft-tissue]
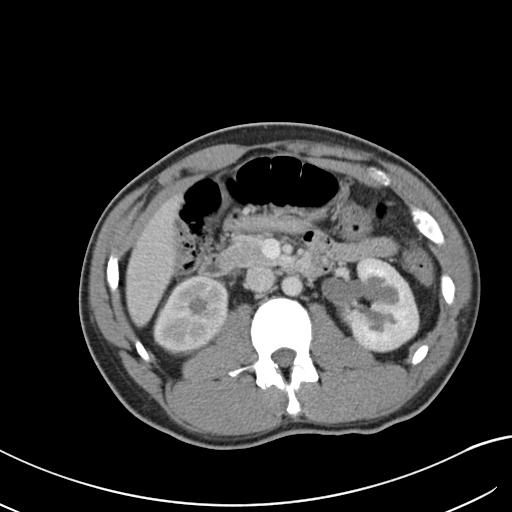
[im 61/89  bone]
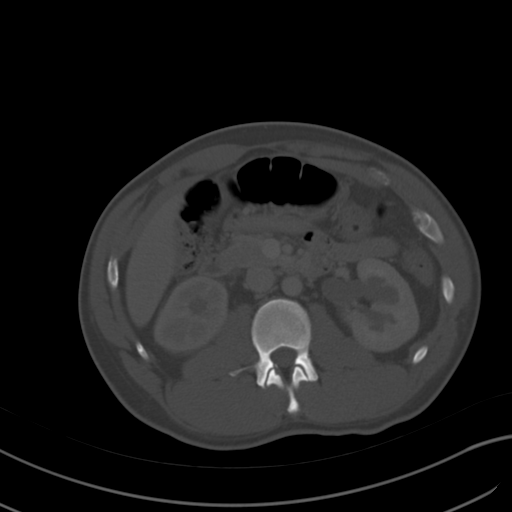
[im 70/89  soft-tissue]
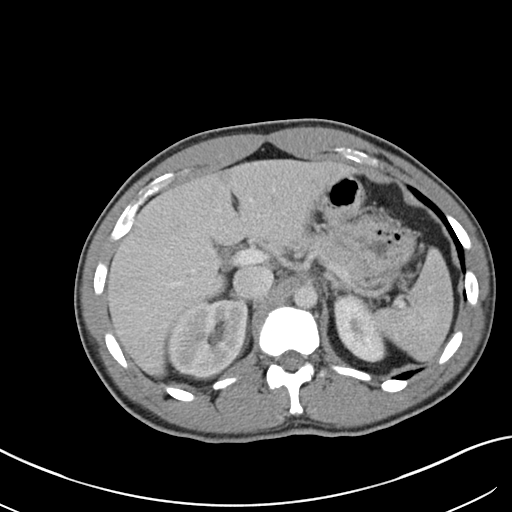
[im 75/89  soft-tissue]
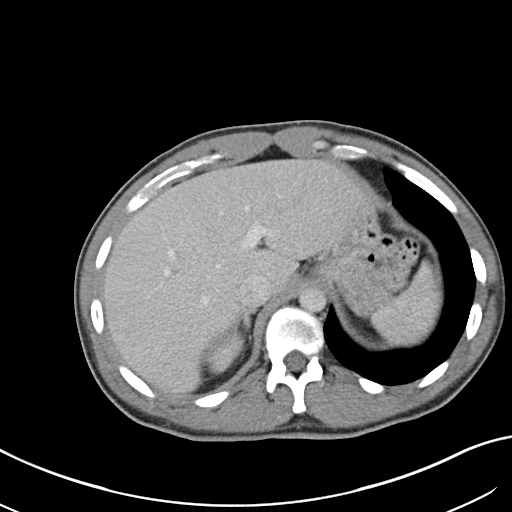
[im 84/89  soft-tissue]
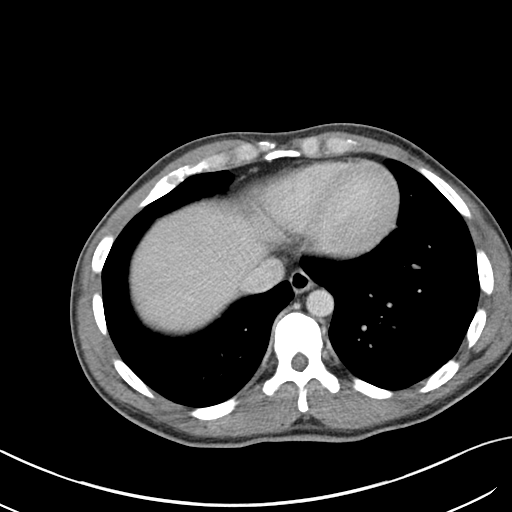

[Series 4: coronal a/|p · coronal · 0.64mm/px · 3 of 108 slices shown]
[im 36/108  soft-tissue]
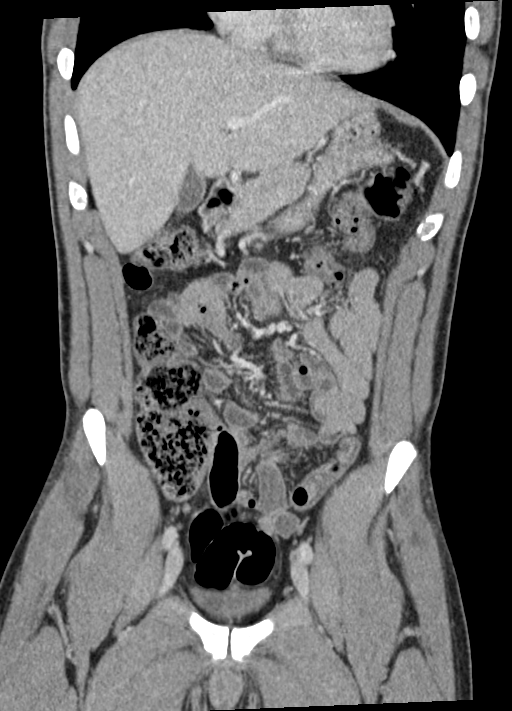
[im 48/108  soft-tissue]
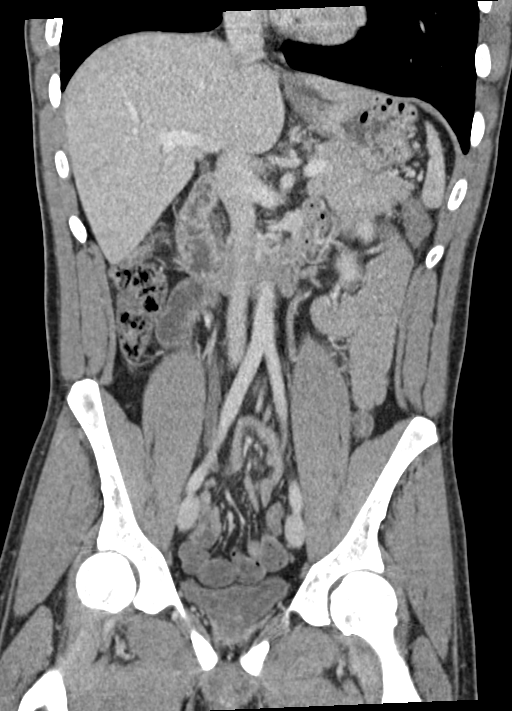
[im 60/108  soft-tissue]
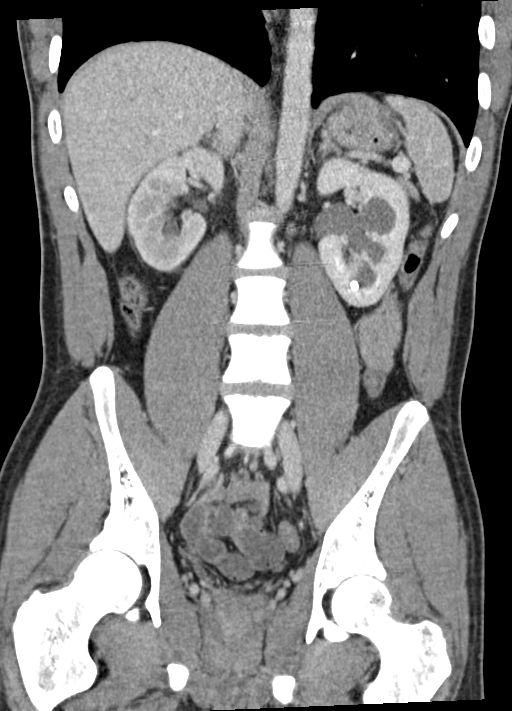

[15 of 46 positions shown; findings below may reference images not displayed]

FINDINGS: Lower chest: Visualized lung bases are clear.

Hepatobiliary: Liver demonstrates a normal contrast enhanced
appearance. Focal fat deposition noted adjacent to the fissure for
ligamentum teres. Gallbladder within normal limits. No biliary
dilatation.

Pancreas: Pancreas within normal limits.

Spleen: Spleen within normal limits.

Adrenals/Urinary Tract: Adrenal glands are normal.

Slightly delayed nephrogram within the right kidney with mild right
hydroureteronephrosis. There is an obstructive 3 mm stone positioned
at the right UVJ (series 2, image 74). No other calculi seen along
the course of the right ureter. Additional 3 mm nonobstructive stone
present within the lower pole the right kidney. Milder right
perinephric and periureteral fat stranding.

On the left, there is moderate left hydronephrosis to the level of
the left UPJ. Left ureter is decompressed distally. No obstructive
stone identified. Finding may reflect sequelae of underlying
stricture. Underlying mass not entirely excluded. 8 mm
nonobstructive stone present within the lower pole left kidney. No
focal enhancing renal mass.

Bladder largely decompressed without acute additional abnormality.

Stomach/Bowel: Stomach within normal limits. No evidence for bowel
obstruction. Appendix normal. No acute inflammatory changes seen
about the bowels.

Vascular/Lymphatic: Normal intravascular enhancement seen throughout
the intra-abdominal aorta and its branch vessels. No adenopathy.

Reproductive: Prostate within normal limits.

Other: No free air or fluid.

Musculoskeletal: No acute osseous abnormality. No worrisome lytic or
blastic osseous lesions.
IMPRESSION: 1. 3 mm obstructive stone at the right UVJ with secondary mild right
hydroureteronephrosis.
2. Additional bilateral nonobstructive nephrolithiasis as above.
3. Moderate left hydronephrosis to the level of the left UVJ. No
obstructive stone identified. Underlying stricture suspected,
although obstructive mass lesion not entirely excluded. Nonemergent
urologic referral suggested.
4. No other acute intra-abdominal or pelvic process.

## 2018-02-10 ENCOUNTER — Emergency Department (HOSPITAL_BASED_OUTPATIENT_CLINIC_OR_DEPARTMENT_OTHER): Payer: Self-pay

## 2018-02-10 ENCOUNTER — Encounter (HOSPITAL_BASED_OUTPATIENT_CLINIC_OR_DEPARTMENT_OTHER): Payer: Self-pay | Admitting: *Deleted

## 2018-02-10 ENCOUNTER — Emergency Department (HOSPITAL_BASED_OUTPATIENT_CLINIC_OR_DEPARTMENT_OTHER)
Admission: EM | Admit: 2018-02-10 | Discharge: 2018-02-10 | Disposition: A | Discharge status: Home / Self Care | Payer: Self-pay | Attending: Emergency Medicine | Admitting: Emergency Medicine

## 2018-02-10 ENCOUNTER — Other Ambulatory Visit: Payer: Self-pay

## 2018-02-10 DIAGNOSIS — J45909 Unspecified asthma, uncomplicated: Secondary | ICD-10-CM | POA: Insufficient documentation

## 2018-02-10 DIAGNOSIS — R072 Precordial pain: Secondary | ICD-10-CM | POA: Insufficient documentation

## 2018-02-10 DIAGNOSIS — Z79899 Other long term (current) drug therapy: Secondary | ICD-10-CM | POA: Insufficient documentation

## 2018-02-10 LAB — CBC
HCT: 38.5 % — ABNORMAL LOW (ref 39.0–52.0)
HEMOGLOBIN: 13.2 g/dL (ref 13.0–17.0)
MCH: 29.8 pg (ref 26.0–34.0)
MCHC: 34.3 g/dL (ref 30.0–36.0)
MCV: 86.9 fL (ref 78.0–100.0)
PLATELETS: 187 10*3/uL (ref 150–400)
RBC: 4.43 MIL/uL (ref 4.22–5.81)
RDW: 13 % (ref 11.5–15.5)
WBC: 3.3 10*3/uL — AB (ref 4.0–10.5)

## 2018-02-10 LAB — TROPONIN I: Troponin I: <0.03 ng/mL (ref <0.03)

## 2018-02-10 LAB — BASIC METABOLIC PANEL
ANION GAP: 7 (ref 5–15)
BUN: 19 mg/dL (ref 6–20)
CO2: 27 mmol/L (ref 22–32)
Calcium: 9.4 mg/dL (ref 8.9–10.3)
Chloride: 105 mmol/L (ref 98–111)
Creatinine, Ser: 1.22 mg/dL (ref 0.61–1.24)
GFR calc Af Amer: >60 mL/min (ref >60)
GLUCOSE: 82 mg/dL (ref 70–99)
POTASSIUM: 4.2 mmol/L (ref 3.5–5.1)
Sodium: 139 mmol/L (ref 135–145)

## 2018-02-10 NOTE — ED Notes (Signed)
Pt resting without complaints. Call bell within reach.

## 2018-02-10 NOTE — Discharge Instructions (Signed)
Please read and follow all provided instructions.  Your diagnoses today include:  1. Precordial pain     Tests performed today include:  An EKG of your heart  A chest x-ray  Cardiac enzymes - a blood test for heart muscle damage  Blood counts and electrolytes  Vital signs. See below for your results today.   Medications prescribed:   None  Take any prescribed medications only as directed.  Follow-up instructions: Please follow-up with your primary care provider as soon as you can for further evaluation of your symptoms.   Return instructions:  SEEK IMMEDIATE MEDICAL ATTENTION IF:  You have severe chest pain, especially if the pain is crushing or pressure-like and spreads to the arms, back, neck, or jaw, or if you have sweating, nausea (feeling sick to your stomach), or shortness of breath. THIS IS AN EMERGENCY. Don't wait to see if the pain will go away. Get medical help at once. Call 911 or [phone] (operator). DO NOT drive yourself to the hospital.   Your chest pain gets worse and does not go away with rest.   You have an attack of chest pain lasting longer than usual, despite rest and treatment with the medications your caregiver has prescribed.   You wake from sleep with chest pain or shortness of breath.  You feel dizzy or faint.  You have chest pain not typical of your usual pain for which you originally saw your caregiver.   You have any other emergent concerns regarding your health.  Additional Information: Chest pain comes from many different causes. Your caregiver has diagnosed you as having chest pain that is not specific for one problem, but does not require admission.  You are at low risk for an acute heart condition or other serious illness.   Your vital signs today were: BP 129/80 (BP Location: Left Arm)    Pulse (!) 52    Temp 98.6 F (37 C) (Oral)    Resp 18    Ht 5\' 11"  (1.803 m)    Wt 69.3 kg (152 lb 12.5 oz)    SpO2 100%    BMI 21.31 kg/m  If your blood  pressure (BP) was elevated above 135/85 this visit, please have this repeated by your doctor within one month. --------------

## 2018-02-10 NOTE — ED Triage Notes (Signed)
Pt states his father passed away last week. The patient found him. He c/o increased stress and intermittent chest pain since then

## 2018-02-10 NOTE — ED Provider Notes (Signed)
[facility] EMERGENCY DEPARTMENT Provider Note   CSN: [id number] Arrival date & time: 02/10/18  1406     History   Chief Complaint Chief Complaint  Patient presents with  . Chest Pain    HPI George Coleman is a 32 y.o. male.  Patient with no significant past medical history presents the emergency department today with complaint of chest pain.  He states that his symptoms started about a week ago after the passing of his father.  He thinks that his father had passed away due to heart attack because he had been having 'heartburn'.  Patient describes a sharp pain in the middle to his left chest that feels like when you stretch a strained muscle.  Symptoms will last about 3 to 4 minutes.  They are intermittent.  Patient is a Advertising copywriterhousekeeper at [employer] and states that he had an episode starting at about 1030 this morning while at work.  He decided that he needed to get checked.  He denies history of hypertension, high cholesterol, diabetes, smoking tobacco although he admits to occasional marijuana use.  He denies cocaine use.  He states that he has been under a lot of stress due to the death of his father as well as family members who are constantly badgering him for money and for car rides.  He thinks that this is making him feel worse and he can feel the chest pain when he is talking to family members who are asking him for money. The onset of this condition was acute. The course is constant. Aggravating factors: stress. Alleviating factors: none.       Past Medical History:  Diagnosis Date  . Asthma     There are no active problems to display for this patient.   History reviewed. No pertinent surgical history.      Home Medications    Prior to Admission medications   Medication Sig Start Date End Date Taking? Authorizing Provider  albuterol (PROVENTIL HFA;VENTOLIN HFA) 108 (90 Base) MCG/ACT inhaler Inhale 2 puffs into the lungs every 6 (six) hours as needed for  wheezing or shortness of breath. 11/28/17   Wieters, Hallie C, PA-C  fluticasone (FLONASE) 50 MCG/ACT nasal spray Place 2 sprays into both nostrils daily. Patient taking differently: Place 2 sprays into both nostrils daily as needed for allergies.  11/13/13   Rodolph Bongorey, Evan S, MD  HYDROcodone-acetaminophen (NORCO/VICODIN) 5-325 MG tablet Take 1-2 tablets by mouth every 4 (four) hours as needed. 02/24/17   Demetrios LollLeaphart, Kenneth T, PA-C  ondansetron (ZOFRAN) 4 MG tablet Take 1 tablet (4 mg total) by mouth every 6 (six) hours. 02/24/17   Rise MuLeaphart, Kenneth T, PA-C  tamsulosin (FLOMAX) 0.4 MG CAPS capsule Take 1 capsule (0.4 mg total) by mouth daily. 02/24/17   Rise MuLeaphart, Kenneth T, PA-C    Family History No family history on file.  Social History Social History   Tobacco Use  . Smoking status: Never Smoker  . Smokeless tobacco: Never Used  Substance Use Topics  . Alcohol use: No  . Drug use: Yes    Types: Marijuana     Allergies   Patient has no known allergies.   Review of Systems Review of Systems  Constitutional: Negative for diaphoresis and fever.  Eyes: Negative for redness.  Respiratory: Negative for cough and shortness of breath.   Cardiovascular: Positive for chest pain. Negative for palpitations and leg swelling.  Gastrointestinal: Negative for abdominal pain, nausea and vomiting.  Genitourinary: Negative for dysuria.  Musculoskeletal: Negative for back pain and neck pain.  Skin: Negative for rash.  Neurological: Negative for syncope and light-headedness.  Psychiatric/Behavioral: The patient is not nervous/anxious.      Physical Exam Updated Vital Signs BP 118/71   Pulse (!) 57   Temp 98.6 F (37 C) (Oral)   Resp (!) 22   Ht 5\' 11"  (1.803 m)   Wt 69.3 kg (152 lb 12.5 oz)   SpO2 100%   BMI 21.31 kg/m   Physical Exam  Constitutional: He appears well-developed and well-nourished.  HENT:  Head: Normocephalic and atraumatic.  Mouth/Throat: Mucous membranes are normal.  Mucous membranes are not dry.  Eyes: Conjunctivae are normal.  Neck: Trachea normal and normal range of motion. Neck supple. Normal carotid pulses and no JVD present. No muscular tenderness present. Carotid bruit is not present. No tracheal deviation present.  Cardiovascular: Normal rate, regular rhythm, S1 normal, S2 normal, normal heart sounds and intact distal pulses. Exam reveals no distant heart sounds and no decreased pulses.  No murmur heard. Pulmonary/Chest: Effort normal and breath sounds normal. No respiratory distress. He has no wheezes. He exhibits no tenderness.  Abdominal: Soft. Normal aorta and bowel sounds are normal. There is no tenderness. There is no rebound and no guarding.  Musculoskeletal: He exhibits no edema.  Neurological: He is alert.  Skin: Skin is warm and dry. He is not diaphoretic. No cyanosis. No pallor.  Psychiatric: His mood appears anxious.  Nursing note and vitals reviewed.    ED Treatments / Results  Labs (all labs ordered are listed, but only abnormal results are displayed) Labs Reviewed  CBC  BASIC METABOLIC PANEL  TROPONIN I    ED ECG REPORT   Date: 02/10/2018  Rate: 57  Rhythm: sinus bradycardia  QRS Axis: normal  Intervals: normal  ST/T Wave abnormalities: early repolarization  Conduction Disutrbances:none  Narrative Interpretation:   Old EKG Reviewed: none available  I have personally reviewed the EKG tracing and agree with the computerized printout as noted.  Radiology No results found.  Procedures Procedures (including critical care time)  Medications Ordered in ED Medications - No data to display   Initial Impression / Assessment and Plan / ED Course  I have reviewed the triage vital signs and the nursing notes.  Pertinent labs & imaging results that were available during my care of the patient were reviewed by me and considered in my medical decision making (see chart for details).     Patient seen and examined.  Work-up initiated.   Vital signs reviewed and are as follows: BP 118/71   Pulse (!) 57   Temp 98.6 F (37 C) (Oral)   Resp (!) 22   Ht 5\' 11"  (1.803 m)   Wt 69.3 kg (152 lb 12.5 oz)   SpO2 100%   BMI 21.31 kg/m   5:03 PM patient is symptom-free.  We discussed all results.  Patient was counseled to return with severe chest pain, especially if the pain is crushing or pressure-like and spreads to the arms, back, neck, or jaw, or if they have sweating, nausea, or shortness of breath with the pain. They were encouraged to call 911 with these symptoms.   They were also told to return if their chest pain gets worse and does not go away with rest, they have an attack of chest pain lasting longer than usual despite rest and treatment with the medications their caregiver has prescribed, if they wake from sleep with chest pain or  shortness of breath, if they feel dizzy or faint, if they have chest pain not typical of their usual pain, or if they have any other emergent concerns regarding their health.  The patient verbalized understanding and agreed.     Final Clinical Impressions(s) / ED Diagnoses   Final diagnoses:  Precordial pain   Patient with chest tightness, atypical, in the setting of recent stressors. Feel patient is low risk for ACS given history (poor story for ACS/MI), negative troponin(s), normal/unchanged EKG. x-ray is normal without signs of infection.  No risk factors for PE.  No tachycardia or hypoxia.  Feel comfortable discharged home at this time with symptomatic treatment, return if worsening.   ED Discharge Orders    None       Renne Crigler, [place] 02/10/18 1704    Tegeler, Canary Brim, MD 02/11/18 [phone]

## 2018-12-14 IMAGING — CR DG CHEST 2V
2 series · 2 of 2 positions shown · non-contrast
Comparison: June 20, 2010

CLINICAL DATA: Chest pain

EXAM:
CHEST - 2 VIEW

[w chest pa]
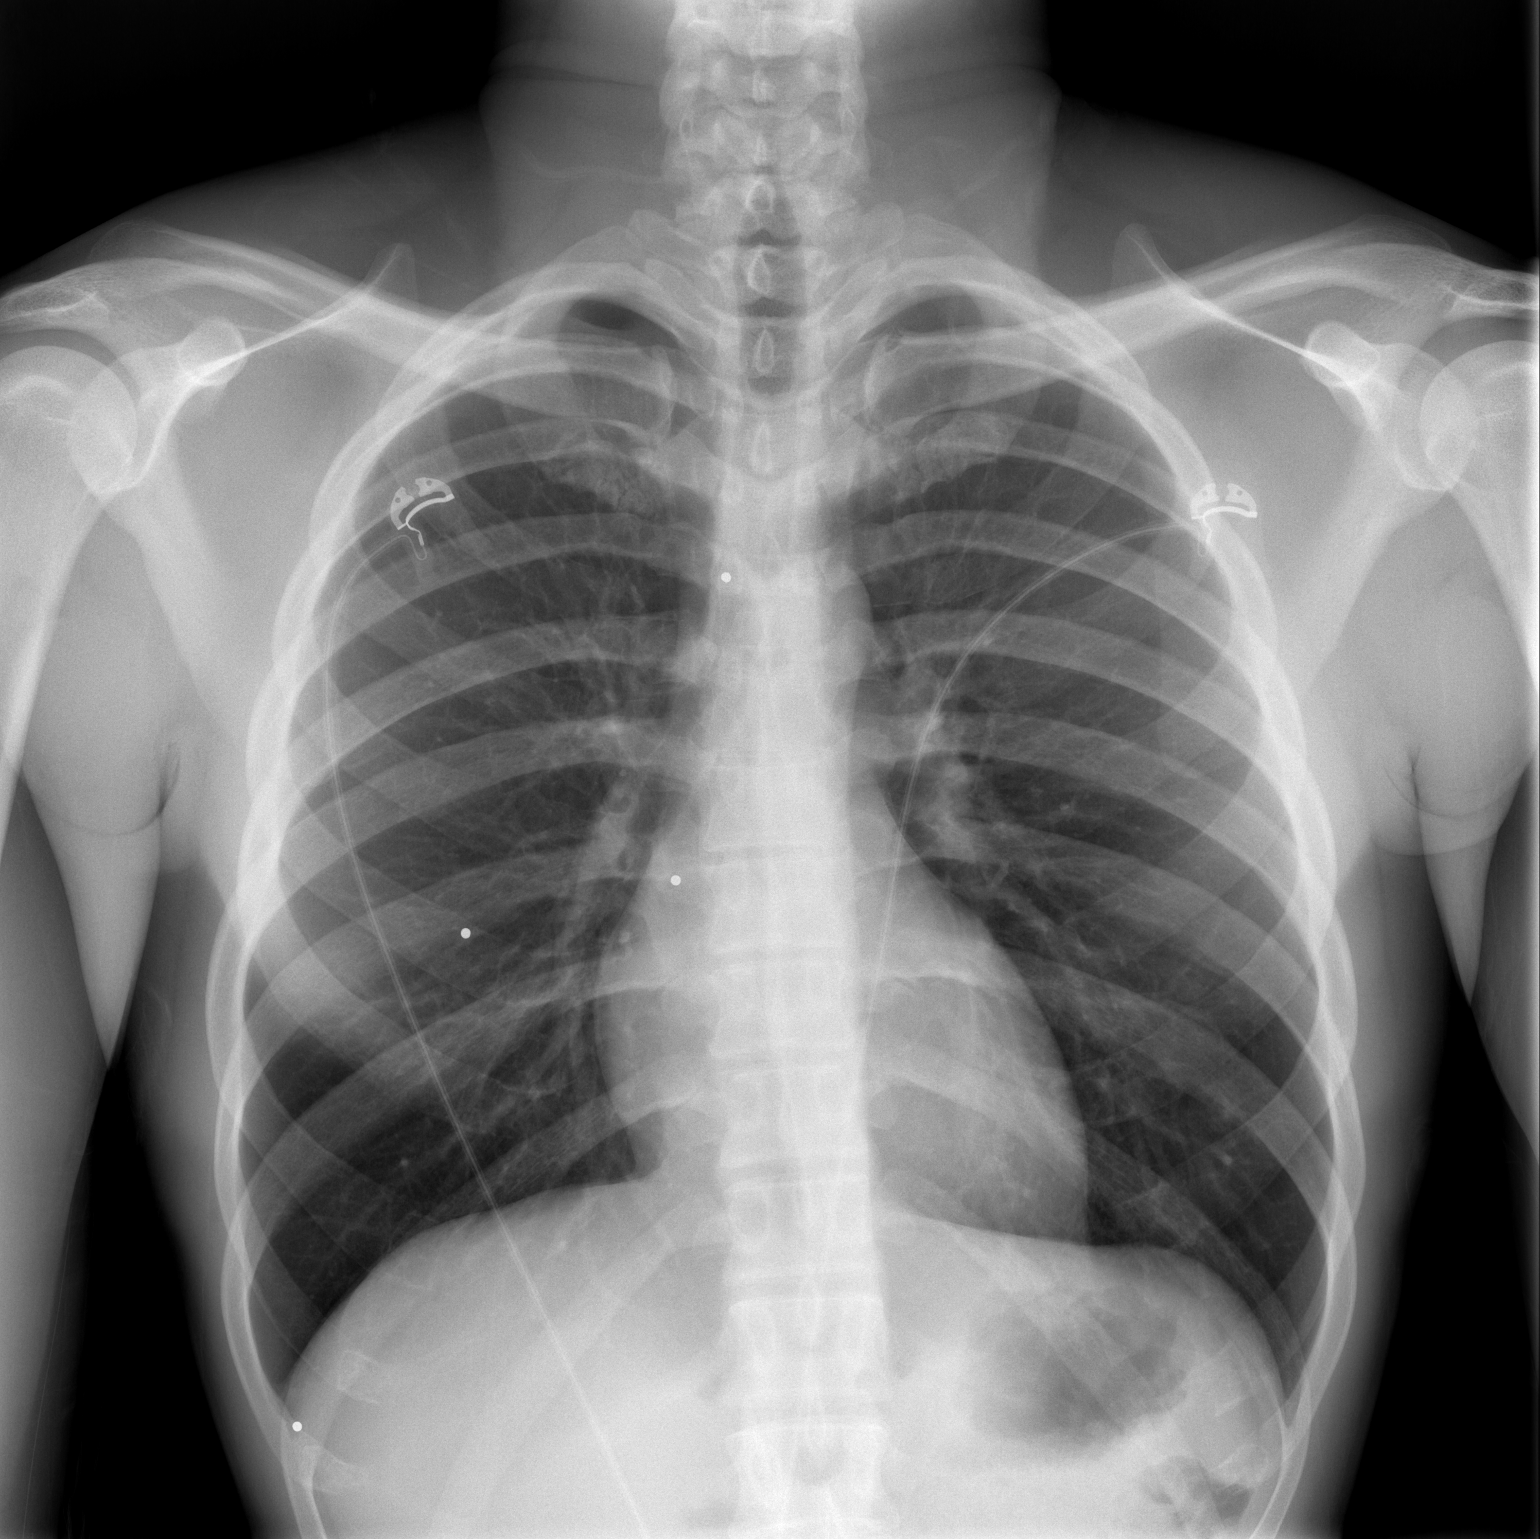

[w chest lat]
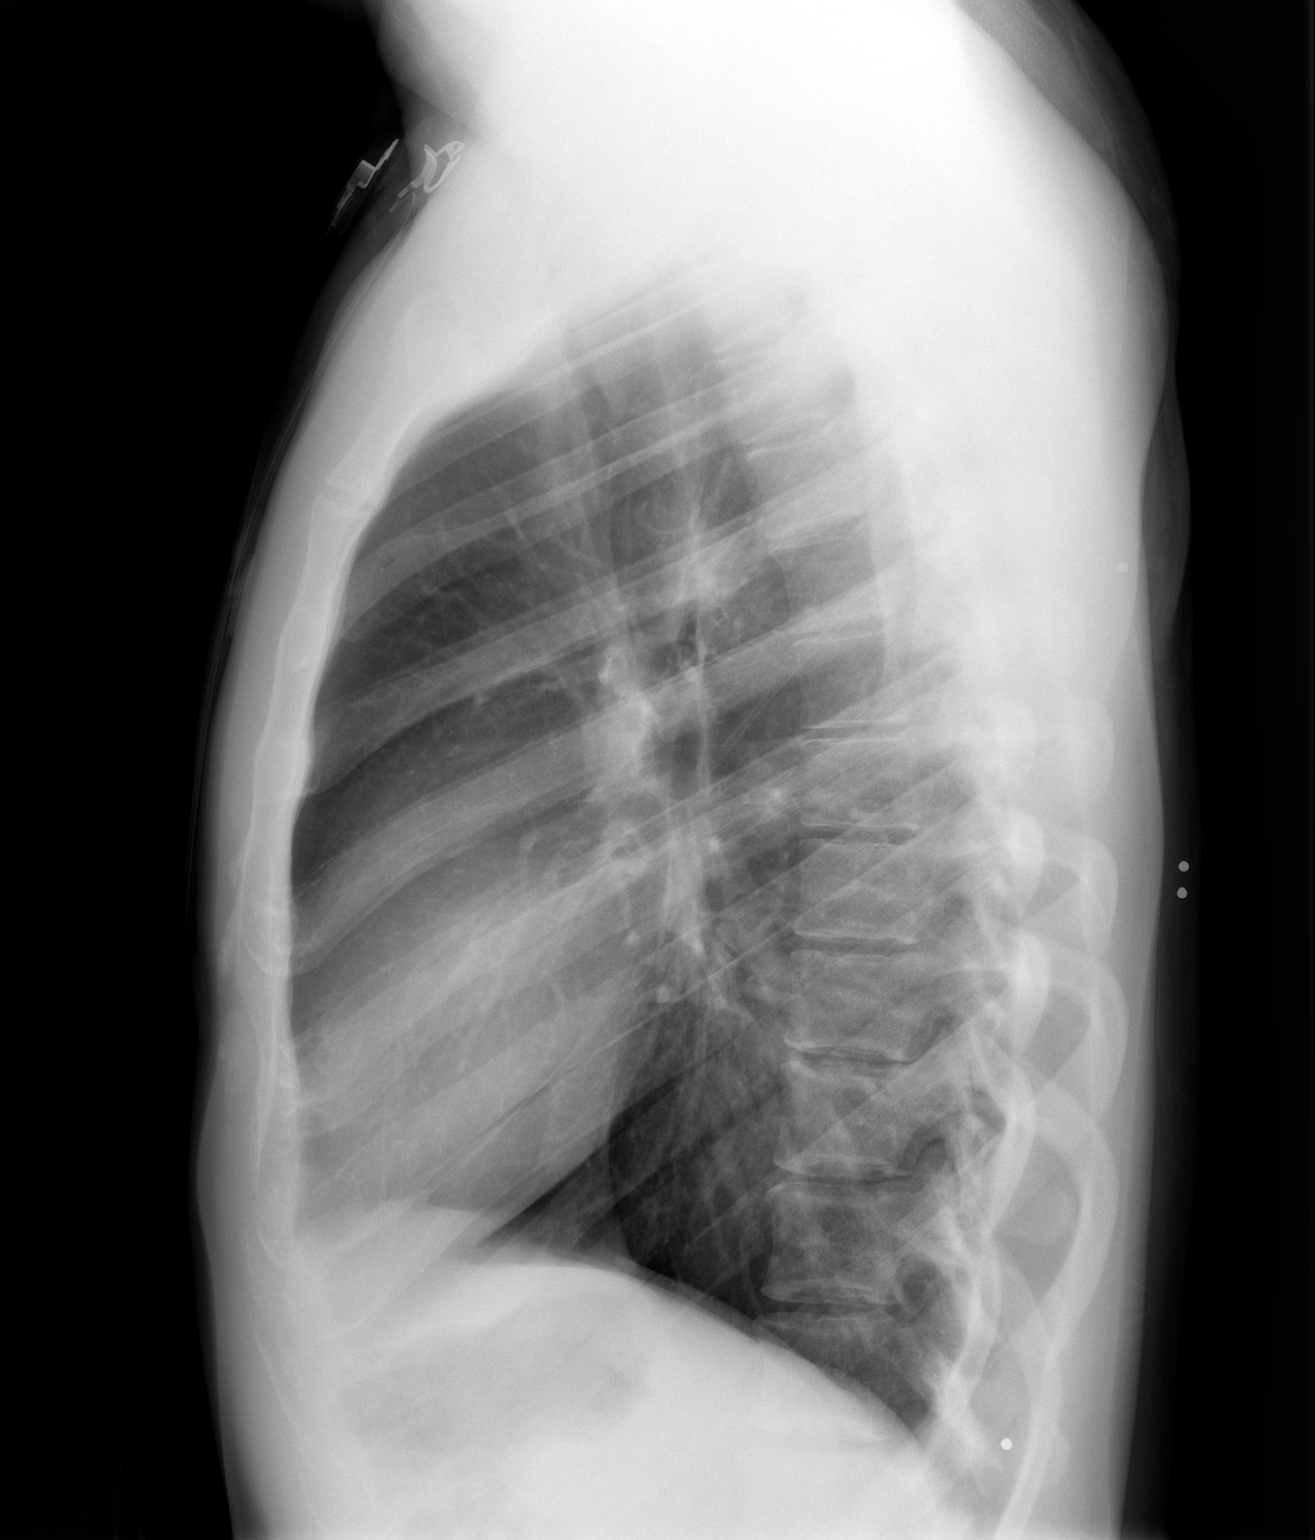

[2 of 2 positions shown; findings below may reference images not displayed]

FINDINGS: There are scattered metallic fragments on the right. Lungs are
clear. Heart size and pulmonary vascularity are normal. No
pneumothorax or pneumomediastinum. No adenopathy. No bone lesions.
IMPRESSION: Several metallic foreign bodies, stable. Lungs clear. Stable cardiac
silhouette.

## 2019-03-25 ENCOUNTER — Ambulatory Visit (HOSPITAL_COMMUNITY)
Admission: EM | Admit: 2019-03-25 | Discharge: 2019-03-25 | Disposition: A | Discharge status: Home / Self Care | Payer: Self-pay | Attending: Internal Medicine | Admitting: Internal Medicine

## 2019-03-25 ENCOUNTER — Encounter (HOSPITAL_COMMUNITY): Payer: Self-pay

## 2019-03-25 ENCOUNTER — Other Ambulatory Visit: Payer: Self-pay

## 2019-03-25 DIAGNOSIS — S139XXA Sprain of joints and ligaments of unspecified parts of neck, initial encounter: Secondary | ICD-10-CM

## 2019-03-25 MED ORDER — NAPROXEN 375 MG PO TABS: 375.0000 mg | ORAL_TABLET | Freq: Two times a day (BID) | ORAL | 0 refills | Status: DC

## 2019-03-25 NOTE — ED Provider Notes (Signed)
[facility]    CSN: [id number] Arrival date & time: 03/25/19  [id number]      History   Chief Complaint Chief Complaint  Patient presents with  . Neck Pain  . Shoulder Pain    HPI George Coleman is a 33 y.o. male with history of asthma-controlled comes to urgent care with complaints of neck pain over the past several days.  Pain started insidiously and is gotten progressively worse.  Pain is currently moderate severity.  It is worsened by movement of the neck.  He is tried Tylenol 3, Percocet with no improvement in the pain.  Pain radiates into the right arm.  No numbness or tingling in the right arm.  No weakness in the right hand.  Patient denies any trauma to the neck.  He has been sleeping in his futon over the past 2 months.  HPI  Past Medical History:  Diagnosis Date  . Asthma     There are no active problems to display for this patient.   History reviewed. No pertinent surgical history.     Home Medications    Prior to Admission medications   Medication Sig Start Date End Date Taking? Authorizing Provider  albuterol (PROVENTIL HFA;VENTOLIN HFA) 108 (90 Base) MCG/ACT inhaler Inhale 2 puffs into the lungs every 6 (six) hours as needed for wheezing or shortness of breath. 11/28/17   Wieters, Hallie C, PA-C  fluticasone (FLONASE) 50 MCG/ACT nasal spray Place 2 sprays into both nostrils daily. Patient taking differently: Place 2 sprays into both nostrils daily as needed for allergies.  11/13/13   Rodolph Bongorey, Evan S, MD  naproxen (NAPROSYN) 375 MG tablet Take 1 tablet (375 mg total) by mouth 2 (two) times daily. 03/25/19   , Britta MccreedyPhilip O, MD  ondansetron (ZOFRAN) 4 MG tablet Take 1 tablet (4 mg total) by mouth every 6 (six) hours. 02/24/17   Rise MuLeaphart, Kenneth T, PA-C  tamsulosin (FLOMAX) 0.4 MG CAPS capsule Take 1 capsule (0.4 mg total) by mouth daily. 02/24/17   Rise MuLeaphart, Kenneth T, PA-C    Family History History reviewed. No pertinent family history.  Social History  Social History   Tobacco Use  . Smoking status: Never Smoker  . Smokeless tobacco: Never Used  Substance Use Topics  . Alcohol use: No  . Drug use: Yes    Types: Marijuana     Allergies   Patient has no known allergies.   Review of Systems Review of Systems  Constitutional: Negative for activity change, chills and fatigue.  HENT: Negative.   Respiratory: Negative.   Cardiovascular: Negative.   Gastrointestinal: Negative.   Genitourinary: Negative.   Musculoskeletal: Positive for arthralgias. Negative for back pain, gait problem, joint swelling and myalgias.  Skin: Negative.   Neurological: Negative.  Negative for dizziness and headaches.     Physical Exam Triage Vital Signs ED Triage Vitals  Enc Vitals Group     BP      Pulse      Resp      Temp      Temp src      SpO2      Weight      Height      Head Circumference      Peak Flow      Pain Score      Pain Loc      Pain Edu?      Excl. in [place]?    No data found.  Updated Vital Signs BP 120/71 (  BP Location: Right Arm)   Pulse 84   Temp 97.8 F (36.6 C)   Resp 16   Wt 74.8 kg   SpO2 100%   BMI 23.01 kg/m   Visual Acuity Right Eye Distance:   Left Eye Distance:   Bilateral Distance:    Right Eye Near:   Left Eye Near:    Bilateral Near:     Physical Exam Vitals signs and nursing note reviewed.  Constitutional:      General: He is in acute distress.     Appearance: He is not ill-appearing.  HENT:     Nose: No congestion or rhinorrhea.  Neck:     Musculoskeletal: Normal range of motion. Neck rigidity and muscular tenderness present.     Comments: Full range of motion with tenderness Cardiovascular:     Rate and Rhythm: Normal rate and regular rhythm.  Pulmonary:     Effort: Pulmonary effort is normal.     Breath sounds: Normal breath sounds.  Abdominal:     General: Bowel sounds are normal.     Palpations: Abdomen is soft.  Musculoskeletal: Normal range of motion.        General:  Tenderness present. No swelling or deformity.     Right lower leg: No edema.  Lymphadenopathy:     Cervical: No cervical adenopathy.  Skin:    General: Skin is warm.     Capillary Refill: Capillary refill takes less than 2 seconds.     Findings: No bruising or erythema.  Neurological:     General: No focal deficit present.     Mental Status: He is alert.      UC Treatments / Results  Labs (all labs ordered are listed, but only abnormal results are displayed) Labs Reviewed - No data to display  EKG   Radiology No results found.  Procedures Procedures (including critical care time)  Medications Ordered in UC Medications - No data to display  Initial Impression / Assessment and Plan / UC Course  I have reviewed the triage vital signs and the nursing notes.  Pertinent labs & imaging results that were available during my care of the patient were reviewed by me and considered in my medical decision making (see chart for details).     1.  Cervical sprain: Naproxen 375 mg twice daily as needed for neck pain Heating pad Gentle stretches No indication for radiographic evaluation since patient does not have any neurologic deficits or signs. If pain worsens patient is advised to return to urgent care to be reevaluated.  Final Clinical Impressions(s) / UC Diagnoses   Final diagnoses:  Neck sprain, initial encounter   Discharge Instructions   None    ED Prescriptions    Medication Sig Dispense Auth. Provider   naproxen (NAPROSYN) 375 MG tablet Take 1 tablet (375 mg total) by mouth 2 (two) times daily. 30 tablet , Myrene Galas, MD     Controlled Substance Prescriptions [place] Controlled Substance Registry consulted? No   Chase Picket, MD 03/25/19 [phone]

## 2019-03-25 NOTE — ED Triage Notes (Signed)
Pt states he has been sleeping on a couch for a week or more. Pt states he has neck and shoulder pain .

## 2019-06-28 ENCOUNTER — Emergency Department (HOSPITAL_COMMUNITY)
Admission: EM | Admit: 2019-06-28 | Discharge: 2019-06-29 | Disposition: A | Discharge status: Home / Self Care | Payer: Self-pay | Attending: Emergency Medicine | Admitting: Emergency Medicine

## 2019-06-28 ENCOUNTER — Other Ambulatory Visit: Payer: Self-pay

## 2019-06-28 ENCOUNTER — Ambulatory Visit (HOSPITAL_COMMUNITY)
Admission: EM | Admit: 2019-06-28 | Discharge: 2019-06-28 | Disposition: A | Discharge status: Home / Self Care | Payer: Self-pay | Attending: Family Medicine | Admitting: Family Medicine

## 2019-06-28 ENCOUNTER — Encounter (HOSPITAL_COMMUNITY): Payer: Self-pay

## 2019-06-28 ENCOUNTER — Encounter (HOSPITAL_COMMUNITY): Payer: Self-pay | Admitting: Family Medicine

## 2019-06-28 DIAGNOSIS — F411 Generalized anxiety disorder: Secondary | ICD-10-CM

## 2019-06-28 DIAGNOSIS — J45909 Unspecified asthma, uncomplicated: Secondary | ICD-10-CM | POA: Insufficient documentation

## 2019-06-28 DIAGNOSIS — R0789 Other chest pain: Secondary | ICD-10-CM | POA: Insufficient documentation

## 2019-06-28 DIAGNOSIS — R079 Chest pain, unspecified: Secondary | ICD-10-CM

## 2019-06-28 NOTE — ED Provider Notes (Signed)
[place]    CSN: [id number] Arrival date & time: 06/28/19  4332      History   Chief Complaint Chief Complaint  Patient presents with  . chest pain, left wrist pain    HPI George Coleman is a 33 y.o. male.   Established [place] patient  This is a 33 year old man who presents with shortness of breath and chest pain.  He has a history of asthma.  He also is complaining of wrist pain.  Patient denies any hard drug use such as cocaine or narcotics.   Patient been under quite a bit of stress lately.  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  Over the last 24 hours patient's notes some tingling in his left wrist and tingling in his left neck.  The symptoms have resolved.  He has had some tightness in his chest and was worried all night that he might be having a heart attack.  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  Past Medical History:  Diagnosis Date  . Asthma     There are no active problems to display for this patient.   History reviewed. No pertinent surgical history.     Home Medications    Prior to Admission medications   Medication Sig Start Date End Date Taking? Authorizing Provider  albuterol (PROVENTIL HFA;VENTOLIN HFA) 108 (90 Base) MCG/ACT inhaler Inhale 2 puffs into the lungs every 6 (six) hours as needed for wheezing or shortness of breath. 11/28/17   Wieters, Hallie C, PA-Coleman  fluticasone (FLONASE) 50 MCG/ACT nasal spray Place 2 sprays into both nostrils daily. Patient taking differently: Place 2 sprays into both nostrils daily as needed for allergies.  11/13/13   Gregor Hams, MD    Family History Family History  Problem Relation Age of Onset  . Healthy Mother   . Healthy Father     Social History Social History   Tobacco Use  . Smoking status: Never Smoker  . Smokeless tobacco: Never Used  Substance Use Topics  . Alcohol use: No  . Drug use: Yes    Types: Marijuana   Comment: everday     Allergies   Shellfish allergy   Review of Systems Review of Systems  Constitutional: Negative.   All other systems reviewed and are negative.    Physical Exam Triage Vital Signs ED Triage Vitals  Enc Vitals Group     BP      Pulse      Resp      Temp      Temp src      SpO2      Weight      Height      Head Circumference      Peak Flow      Pain Score      Pain Loc      Pain Edu?      Excl. in [place]?    No data found.  Updated Vital Signs BP (!) 165/89 (BP Location: Right Arm)   Pulse 89   Temp 99.1 F (37.3 Coleman) (Oral)   Resp 16   SpO2 98%   Physical Exam Vitals signs and nursing note reviewed.  Constitutional:      General: He is not in acute distress.    Appearance: Normal appearance. He is normal weight. He is not toxic-appearing.  HENT:     Head: Normocephalic and  atraumatic.  Eyes:     Conjunctiva/sclera: Conjunctivae normal.  Neck:     Musculoskeletal: Normal range of motion and neck supple.  Cardiovascular:     Rate and Rhythm: Normal rate and regular rhythm.     Pulses: Normal pulses.     Heart sounds: Normal heart sounds.  Pulmonary:     Effort: Pulmonary effort is normal.     Breath sounds: Normal breath sounds.  Musculoskeletal: Normal range of motion.  Skin:    General: Skin is warm.  Neurological:     General: No focal deficit present.     Mental Status: He is alert.  Psychiatric:        Mood and Affect: Mood normal.    I spent 25 minutes talking the patient about meditating and eating right.  We discussed what is bothering him and what he can do about it.  UC Treatments / Results  Labs (all labs ordered are listed, but only abnormal results are displayed) Labs Reviewed - No data to display  EKG   Radiology No results found.  Procedures Procedures (including critical care time)  Medications Ordered in UC Medications - No data to display  Initial Impression / Assessment and Plan / UC Course  I  have reviewed the triage vital signs and the nursing notes.  Pertinent labs & imaging results that were available during my care of the patient were reviewed by me and considered in my medical decision making (see chart for details).    Final Clinical Impressions(s) / UC Diagnoses   Final diagnoses:  Anxiety state   Discharge Instructions   None    ED Prescriptions    None     I have reviewed the PDMP during this encounter.   George Sidle, MD 06/28/19 [phone]

## 2019-06-28 NOTE — ED Triage Notes (Signed)
Pt presents to UC w/ c/o left wrist pain and tingling, chest pain, left neck pain yesterday afternoon. Pt denies symptoms at this time.

## 2019-06-28 NOTE — ED Triage Notes (Addendum)
Pt arrives for eval of CP x 3 days. Pt reports he "finally had time to get it checked out". Reports orthopnea x 2-3 days as well. 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 Negative work up at [facility] this AM

## 2019-06-29 NOTE — ED Provider Notes (Signed)
[place] EMERGENCY DEPARTMENT Provider Note   CSN: [id number] Arrival date & time: 06/28/19  1704     History   Chief Complaint Chief Complaint  Patient presents with  . Chest Pain    HPI George Coleman is a 33 y.o. male.     The history is provided by the patient.  Chest Pain Pain location:  L chest Pain radiates to:  Does not radiate Pain severity:  No pain Timing:  Rare Chronicity:  New Context: not breathing   Relieved by:  None tried Worsened by:  Nothing Ineffective treatments:  None tried   Past Medical History:  Diagnosis Date  . Asthma     There are no active problems to display for this patient.   History reviewed. No pertinent surgical history.      Home Medications    Prior to Admission medications   Medication Sig Start Date End Date Taking? Authorizing Provider  albuterol (PROVENTIL HFA;VENTOLIN HFA) 108 (90 Base) MCG/ACT inhaler Inhale 2 puffs into the lungs every 6 (six) hours as needed for wheezing or shortness of breath. 11/28/17   Wieters, Hallie C, PA-C  fluticasone (FLONASE) 50 MCG/ACT nasal spray Place 2 sprays into both nostrils daily. Patient taking differently: Place 2 sprays into both nostrils daily as needed for allergies.  11/13/13   Gregor Hams, MD    Family History Family History  Problem Relation Age of Onset  . Healthy Mother   . Healthy Father     Social History Social History   Tobacco Use  . Smoking status: Never Smoker  . Smokeless tobacco: Never Used  Substance Use Topics  . Alcohol use: No  . Drug use: Yes    Types: Marijuana    Comment: everday     Allergies   Shellfish allergy   Review of Systems Review of Systems  Cardiovascular: Positive for chest pain.  All other systems reviewed and are negative.   Physical Exam Updated Vital Signs BP 127/79 (BP Location: Right Arm)   Pulse 60   Temp 98.3 F (36.8 C) (Oral)   Resp 18   Ht 5\' 11"  (1.803 m)   Wt 77.1 kg   SpO2  99%   BMI 23.71 kg/m   Physical Exam Vitals signs and nursing note reviewed.  Constitutional:      Appearance: He is well-developed.  HENT:     Head: Normocephalic and atraumatic.     Mouth/Throat:     Mouth: Mucous membranes are dry.     Pharynx: Oropharynx is clear.  Neck:     Musculoskeletal: Normal range of motion.  Cardiovascular:     Rate and Rhythm: Normal rate.  Pulmonary:     Effort: Pulmonary effort is normal. No respiratory distress.  Abdominal:     General: There is no distension.     Palpations: Abdomen is soft.  Musculoskeletal: Normal range of motion.  Skin:    General: Skin is warm and dry.  Neurological:     General: No focal deficit present.     Mental Status: He is alert.     ED Treatments / Results  Labs (all labs ordered are listed, but only abnormal results are displayed) Labs Reviewed - No data to display  EKG EKG Interpretation  Date/Time:  Friday June 28 2019 17:17:00 EST Ventricular Rate:  72 PR Interval:  164 QRS Duration: 90 QT Interval:  360 QTC Calculation: 394 R Axis:   85 Text Interpretation: Normal sinus  rhythm Nonspecific T wave abnormality Abnormal ECG No significant change since last tracing Confirmed by Marily Memos [phone]) on 06/29/2019 1:15:41 AM   Radiology No results found.  Procedures Procedures (including critical care time)  Medications Ordered in ED Medications - No data to display   Initial Impression / Assessment and Plan / ED Course  I have reviewed the triage vital signs and the nursing notes.  Pertinent labs & imaging results that were available during my care of the patient were reviewed by me and considered in my medical decision making (see chart for details).        Overly anxious male here with chest pain.  Unlikely be ACS.  Unlikely to be PE.  Suspect is related anxiety.  Final Clinical Impressions(s) / ED Diagnoses   Final diagnoses:  Nonspecific chest pain    ED Discharge Orders     None       Priyanka Causey, Barbara Cower, MD 06/29/19 [phone]

## 2019-07-02 ENCOUNTER — Ambulatory Visit: Payer: Self-pay

## 2019-07-02 NOTE — Telephone Encounter (Signed)
Incoming call from Patient .with a complaint that he has SOB  Onset is 3 to 4 days ago.  States it comes and goes.  Rates it as mild.  Has a hx of asthma.  Patient seeking an PCP.  Attempted to schedule with a PCP. No close available appointment  .  Will call back states Patient.        Reason for Disposition . [1] MILD longstanding difficulty breathing AND [2]  SAME as normal  Answer Assessment - Initial Assessment Questions 1. RESPIRATORY STATUS: "Describe your breathing?" (e.g., wheezing, shortness of breath, unable to speak, severe coughing)      SOB 2. ONSET: "When did this breathing problem begin?"     3 to 4 days ago 3. PATTERN "Does the difficult breathing come and go, or has it been constant since it started?"     Comes and goes 4. SEVERITY: "How bad is your breathing?" (e.g., mild, moderate, severe)    - MILD: No SOB at rest, mild SOB with walking, speaks normally in sentences, can lay down, no retractions, pulse < 100.    - MODERATE: SOB at rest, SOB with minimal exertion and prefers to sit, cannot lie down flat, speaks in phrases, mild retractions, audible wheezing, pulse 100-120.    - SEVERE: Very SOB at rest, speaks in single words, struggling to breathe, sitting hunched forward, retractions, pulse > 120      mild 5. RECURRENT SYMPTOM: "Have you had difficulty breathing before?" If so, ask: "When was the last time?" and "What happened that time?"     denies 6. CARDIAC HISTORY: "Do you have any history of heart disease?" (e.g., heart attack, angina, bypass surgery, angioplasty)       7. LUNG HISTORY: "Do you have any history of lung disease?"  (e.g., pulmonary embolus, asthma, emphysema)     asthma 8. CAUSE: "What do you think is causing the breathing problem?"      *No Answer* 9. OTHER SYMPTOMS: "Do you have any other symptoms? (e.g., dizziness, runny nose, cough, chest pain, fever)    denies 10. PREGNANCY: "Is there any chance you are pregnant?" "When was your last  menstrual period?"       na 11. TRAVEL: "Have you traveled out of the country in the last month?" (e.g., travel history, exposures)       *No Answer*  Protocols used: BREATHING DIFFICULTY-A-AH

## 2022-04-18 ENCOUNTER — Ambulatory Visit
Admission: EM | Admit: 2022-04-18 | Discharge: 2022-04-18 | Disposition: A | Discharge status: Home / Self Care | Payer: Self-pay | Attending: Urgent Care | Admitting: Urgent Care

## 2022-04-18 DIAGNOSIS — R220 Localized swelling, mass and lump, head: Secondary | ICD-10-CM

## 2022-04-18 DIAGNOSIS — T783XXA Angioneurotic edema, initial encounter: Secondary | ICD-10-CM

## 2022-04-18 MED ORDER — HYDROXYZINE HCL 25 MG PO TABS: 12.5000 mg | ORAL_TABLET | Freq: Three times a day (TID) | ORAL | 0 refills | Status: AC | PRN

## 2022-04-18 MED ORDER — PREDNISONE 20 MG PO TABS: ORAL_TABLET | ORAL | 0 refills | Status: AC

## 2022-04-18 NOTE — ED Triage Notes (Signed)
The patient states this morning he woke up with a swollen upper lip (left side). The pt denies SOB. The patient denies using any new products.

## 2022-04-18 NOTE — ED Provider Notes (Signed)
[facility]  Note:  This document was prepared using Systems analyst and may include unintentional dictation errors.  MRN: [id number] DOB: 09-18-1985  Subjective:   George Coleman is a 36 y.o. male presenting for acute onset since this morning of upper left sided lip swelling.  Denies fever, pain, drainage, oral pain, dental pain, bleeding, trauma.  No new exposures that he can think of.  Same dietary routine.  No lisinopril use.   No current facility-administered medications for this encounter.  Current Outpatient Medications:    albuterol (PROVENTIL HFA;VENTOLIN HFA) 108 (90 Base) MCG/ACT inhaler, Inhale 2 puffs into the lungs every 6 (six) hours as needed for wheezing or shortness of breath., Disp: 1 Inhaler, Rfl: 2   fluticasone (FLONASE) 50 MCG/ACT nasal spray, Place 2 sprays into both nostrils daily. (Patient taking differently: Place 2 sprays into both nostrils daily as needed for allergies. ), Disp: 16 g, Rfl: 2   Allergies  Allergen Reactions   Shellfish Allergy     Past Medical History:  Diagnosis Date   Asthma      History reviewed. No pertinent surgical history.  Family History  Problem Relation Age of Onset   Healthy Mother    Healthy Father     Social History   Tobacco Use   Smoking status: Never   Smokeless tobacco: Never  Vaping Use   Vaping Use: Never used  Substance Use Topics   Alcohol use: No   Drug use: Yes    Types: Marijuana    Comment: everday    ROS   Objective:   Vitals: There were no vitals taken for this visit.  Physical Exam Constitutional:      General: He is not in acute distress.    Appearance: Normal appearance. He is well-developed and normal weight. He is not ill-appearing, toxic-appearing or diaphoretic.  HENT:     Head: Normocephalic and atraumatic.     Right Ear: External ear normal.     Left Ear: External ear normal.     Nose: Nose normal.     Mouth/Throat:     Mouth:  Mucous membranes are moist.     Pharynx: No pharyngeal swelling, oropharyngeal exudate, posterior oropharyngeal erythema or uvula swelling.     Tonsils: No tonsillar exudate or tonsillar abscesses. 0 on the right. 0 on the left.      Comments: Airway is patent.  Patient is controlling secretions.  No gumline tenderness no tenderness of the anterior oral mucosa.  No drainage of pus or bleeding. Eyes:     General: No scleral icterus.       Right eye: No discharge.        Left eye: No discharge.     Extraocular Movements: Extraocular movements intact.  Cardiovascular:     Rate and Rhythm: Normal rate and regular rhythm.     Heart sounds: Normal heart sounds. No murmur heard.    No friction rub. No gallop.  Pulmonary:     Effort: Pulmonary effort is normal. No respiratory distress.     Breath sounds: Normal breath sounds. No stridor. No wheezing, rhonchi or rales.  Musculoskeletal:     Cervical back: Normal range of motion.  Neurological:     Mental Status: He is alert and oriented to person, place, and time.  Psychiatric:        Mood and Affect: Mood normal.        Behavior: Behavior normal.  Thought Content: Thought content normal.        Judgment: Judgment normal.     Assessment and Plan :   PDMP not reviewed this encounter.  1. Angioedema, initial encounter   2. Swelling of upper lip    Undifferentiated angioedema.  Use prednisone.  Hydroxyzine for itching.  No signs of anaphylaxis.  Will defer ER visit for now. Counseled patient on potential for adverse effects with medications prescribed/recommended today, ER and return-to-clinic precautions discussed, patient verbalized understanding.    Wallis Bamberg, PA-C 04/18/22 1125

## 2022-07-31 ENCOUNTER — Ambulatory Visit
Admission: EM | Admit: 2022-07-31 | Discharge: 2022-07-31 | Disposition: A | Discharge status: Home / Self Care | Payer: Self-pay

## 2022-07-31 DIAGNOSIS — F17200 Nicotine dependence, unspecified, uncomplicated: Secondary | ICD-10-CM

## 2022-07-31 DIAGNOSIS — H9202 Otalgia, left ear: Secondary | ICD-10-CM

## 2022-07-31 DIAGNOSIS — T162XXA Foreign body in left ear, initial encounter: Secondary | ICD-10-CM

## 2022-07-31 NOTE — ED Provider Notes (Signed)
[facility]  Note:  This document was prepared using Conservation officer, historic buildings and may include unintentional dictation errors.  MRN: [id number] DOB: 15-Mar-1986  Subjective:   George Coleman is a 37 y.o. male presenting for 2 day history of persistent left ear discomfort, foreign body sensation.  Patient states that he placed a piece toilet paper tissue and there to prevent embolic from getting in his ear.  He also wants a heart and lung exam as he is a smoker, does marijuana.  Has a history of asthma.  No cough, chest pain, shortness of breath or wheezing, fever.  No ear drainage, tinnitus.  No current facility-administered medications for this encounter.  Current Outpatient Medications:    albuterol (PROVENTIL HFA;VENTOLIN HFA) 108 (90 Base) MCG/ACT inhaler, Inhale 2 puffs into the lungs every 6 (six) hours as needed for wheezing or shortness of breath., Disp: 1 Inhaler, Rfl: 2   fluticasone (FLONASE) 50 MCG/ACT nasal spray, Place 2 sprays into both nostrils daily. (Patient taking differently: Place 2 sprays into both nostrils daily as needed for allergies.), Disp: 16 g, Rfl: 2   hydrOXYzine (ATARAX) 25 MG tablet, Take 0.5-1 tablets (12.5-25 mg total) by mouth every 8 (eight) hours as needed for itching., Disp: 30 tablet, Rfl: 0   predniSONE (DELTASONE) 20 MG tablet, Take 2 tablets daily with breakfast., Disp: 10 tablet, Rfl: 0   Allergies  Allergen Reactions   Shellfish Allergy     Past Medical History:  Diagnosis Date   Asthma      History reviewed. No pertinent surgical history.  Family History  Problem Relation Age of Onset   Healthy Mother    Healthy Father     Social History   Tobacco Use   Smoking status: Never   Smokeless tobacco: Never  Vaping Use   Vaping Use: Never used  Substance Use Topics   Alcohol use: No   Drug use: Yes    Types: Marijuana    Comment: everday    ROS   Objective:   Vitals: BP 124/76 (BP  Location: Left Arm)   Pulse 67   Temp 97.9 F (36.6 C) (Oral)   Resp 18   Ht 5' (1.524 m)   Wt 170 lb (77.1 kg)   SpO2 96%   BMI 33.20 kg/m   Physical Exam Constitutional:      General: He is not in acute distress.    Appearance: Normal appearance. He is well-developed and normal weight. He is not ill-appearing, toxic-appearing or diaphoretic.  HENT:     Head: Normocephalic and atraumatic.     Right Ear: Tympanic membrane, ear canal and external ear normal. There is no impacted cerumen.     Left Ear: Tympanic membrane, ear canal and external ear normal. There is no impacted cerumen.     Nose: Nose normal.     Mouth/Throat:     Mouth: Mucous membranes are moist.  Eyes:     General: No scleral icterus.       Right eye: No discharge.        Left eye: No discharge.     Extraocular Movements: Extraocular movements intact.  Cardiovascular:     Rate and Rhythm: Normal rate and regular rhythm.     Heart sounds: Normal heart sounds. No murmur heard.    No friction rub. No gallop.  Pulmonary:     Effort: Pulmonary effort is normal. No respiratory distress.     Breath sounds: Normal  breath sounds. No stridor. No wheezing, rhonchi or rales.  Musculoskeletal:     Cervical back: Normal range of motion.  Neurological:     Mental Status: He is alert and oriented to person, place, and time.  Psychiatric:        Mood and Affect: Mood normal.        Behavior: Behavior normal.        Thought Content: Thought content normal.        Judgment: Judgment normal.    Foreign body removed in its entirety using alligator forceps and was revealed to be tissue paper.  Assessment and Plan :   PDMP not reviewed this encounter.  1. Discomfort of left ear   2. Foreign body of left ear, initial encounter   3. Smoker     No signs of secondary infection. Deferred imaging given clear cardiopulmonary exam, hemodynamically stable vital signs.  Advised that he quit smoking. Counseled patient on  potential for adverse effects with medications prescribed/recommended today, ER and return-to-clinic precautions discussed, patient verbalized understanding.    Jaynee Eagles, PA-C 07/31/22 1052

## 2022-07-31 NOTE — ED Triage Notes (Signed)
Pt states that he has a foreign object in left ear. X2 days

## 2022-12-08 ENCOUNTER — Ambulatory Visit: Payer: Self-pay | Admitting: Internal Medicine

## 2022-12-09 ENCOUNTER — Ambulatory Visit: Payer: Self-pay | Attending: Internal Medicine | Admitting: Internal Medicine

## 2022-12-09 NOTE — Progress Notes (Deleted)
Cardiology Office Note:    Date:  12/09/2022   ID:  George Coleman, DOB 02/05/1986, MRN [id number]  PCP:  Patient, No Pcp Per   [facility] Providers Cardiologist:  None { Click to update primary MD,subspecialty MD or APP then REFRESH:1}    Referring MD: No ref. provider found   No chief complaint on file. ***  History of Present Illness:    George Coleman is a 37 y.o. male with no significant pmhx. Patient being referred for smoking.  Past Medical History:  Diagnosis Date   Asthma     No past surgical history on file.  Current Medications: No outpatient medications have been marked as taking for the 12/09/22 encounter (Appointment) with [name], MD.     Allergies:   Shellfish allergy   Social History   Socioeconomic History   Marital status: Single    Spouse name: Not on file   Number of children: Not on file   Years of education: Not on file   Highest education level: Not on file  Occupational History   Not on file  Tobacco Use   Smoking status: Never   Smokeless tobacco: Never  Vaping Use   Vaping Use: Never used  Substance and Sexual Activity   Alcohol use: No   Drug use: Yes    Types: Marijuana    Comment: everday   Sexual activity: Not on file  Other Topics Concern   Not on file  Social History Narrative   Not on file   Social Determinants of Health   Financial Resource Strain: Not on file  Food Insecurity: Not on file  Transportation Needs: Not on file  Physical Activity: Not on file  Stress: Not on file  Social Connections: Not on file     Family History: The patient's ***family history includes Healthy in his father and mother.  ROS:   Please see the history of present illness.    *** All other systems reviewed and are negative.  EKGs/Labs/Other Studies Reviewed:    The following studies were reviewed today: ***  EKG:  EKG is *** ordered today.  The ekg ordered today demonstrates ***  Recent Labs: No  results found for requested labs within last 365 days.  Recent Lipid Panel No results found for: "CHOL", "TRIG", "HDL", "CHOLHDL", "VLDL", "LDLCALC", "LDLDIRECT"   Risk Assessment/Calculations:   {Does this patient have ATRIAL FIBRILLATION?:[id number]}  No BP recorded.  {Refresh Note OR Click here to enter BP  :1}***         Physical Exam:    VS:  There were no vitals taken for this visit.    Wt Readings from Last 3 Encounters:  07/31/22 170 lb (77.1 kg)  06/28/19 170 lb (77.1 kg)  03/25/19 165 lb (74.8 kg)     GEN: *** Well nourished, well developed in no acute distress HEENT: Normal NECK: No JVD; No carotid bruits LYMPHATICS: No lymphadenopathy CARDIAC: ***RRR, no murmurs, rubs, gallops RESPIRATORY:  Clear to auscultation without rales, wheezing or rhonchi  ABDOMEN: Soft, non-tender, non-distended MUSCULOSKELETAL:  No edema; No deformity  SKIN: Warm and dry NEUROLOGIC:  Alert and oriented x 3 PSYCHIATRIC:  Normal affect   ASSESSMENT:    No diagnosis found. PLAN:    In order of problems listed above:  ***      {Are you ordering a CV Procedure (e.g. stress test, cath, DCCV, TEE, etc)?   Press F2        :[id number]}  Medication Adjustments/Labs and Tests Ordered: Current medicines are reviewed at length with the patient today.  Concerns regarding medicines are outlined above.  No orders of the defined types were placed in this encounter.  No orders of the defined types were placed in this encounter.   There are no Patient Instructions on file for this visit.   Signed, [name], MD  12/09/2022 7:37 AM    [facility]

## 2023-10-05 ENCOUNTER — Emergency Department (HOSPITAL_COMMUNITY)
Admission: EM | Admit: 2023-10-05 | Discharge: 2023-10-05 | Disposition: A | Discharge status: Home / Self Care | Payer: Self-pay | Attending: Emergency Medicine | Admitting: Emergency Medicine

## 2023-10-05 ENCOUNTER — Other Ambulatory Visit: Payer: Self-pay

## 2023-10-05 DIAGNOSIS — R112 Nausea with vomiting, unspecified: Secondary | ICD-10-CM | POA: Insufficient documentation

## 2023-10-05 DIAGNOSIS — R109 Unspecified abdominal pain: Secondary | ICD-10-CM | POA: Insufficient documentation

## 2023-10-05 DIAGNOSIS — R197 Diarrhea, unspecified: Secondary | ICD-10-CM | POA: Insufficient documentation

## 2023-10-05 DIAGNOSIS — J45909 Unspecified asthma, uncomplicated: Secondary | ICD-10-CM | POA: Insufficient documentation

## 2023-10-05 DIAGNOSIS — Z20822 Contact with and (suspected) exposure to covid-19: Secondary | ICD-10-CM | POA: Insufficient documentation

## 2023-10-05 DIAGNOSIS — Z7951 Long term (current) use of inhaled steroids: Secondary | ICD-10-CM | POA: Insufficient documentation

## 2023-10-05 LAB — URINALYSIS, ROUTINE W REFLEX MICROSCOPIC
Bilirubin Urine: NEGATIVE
Glucose, UA: NEGATIVE mg/dL
Hgb urine dipstick: NEGATIVE
Ketones, ur: 5 mg/dL — AB
Leukocytes,Ua: NEGATIVE
Nitrite: NEGATIVE
Protein, ur: NEGATIVE mg/dL
Specific Gravity, Urine: 1.026 (ref 1.005–1.030)
pH: 5 (ref 5.0–8.0)

## 2023-10-05 LAB — COMPREHENSIVE METABOLIC PANEL
ALT: 59 U/L — ABNORMAL HIGH (ref 0–44)
AST: 45 U/L — ABNORMAL HIGH (ref 15–41)
Albumin: 4.6 g/dL (ref 3.5–5.0)
Alkaline Phosphatase: 41 U/L (ref 38–126)
Anion gap: 11 (ref 5–15)
BUN: 18 mg/dL (ref 6–20)
CO2: 23 mmol/L (ref 22–32)
Calcium: 9.5 mg/dL (ref 8.9–10.3)
Chloride: 105 mmol/L (ref 98–111)
Creatinine, Ser: 0.95 mg/dL (ref 0.61–1.24)
GFR, Estimated: >60 mL/min (ref >60)
Glucose, Bld: 120 mg/dL — ABNORMAL HIGH (ref 70–99)
Potassium: 4 mmol/L (ref 3.5–5.1)
Sodium: 139 mmol/L (ref 135–145)
Total Bilirubin: 1.1 mg/dL (ref 0.0–1.2)
Total Protein: 8.1 g/dL (ref 6.5–8.1)

## 2023-10-05 LAB — CBC
HCT: 44.9 % (ref 39.0–52.0)
Hemoglobin: 14.5 g/dL (ref 13.0–17.0)
MCH: 28.6 pg (ref 26.0–34.0)
MCHC: 32.3 g/dL (ref 30.0–36.0)
MCV: 88.6 fL (ref 80.0–100.0)
Platelets: 223 10*3/uL (ref 150–400)
RBC: 5.07 MIL/uL (ref 4.22–5.81)
RDW: 14.3 % (ref 11.5–15.5)
WBC: 7.6 10*3/uL (ref 4.0–10.5)
nRBC: 0 % (ref 0.0–0.2)

## 2023-10-05 LAB — RESP PANEL BY RT-PCR (RSV, FLU A&B, COVID)  RVPGX2
Influenza A by PCR: NEGATIVE
Influenza B by PCR: NEGATIVE
Resp Syncytial Virus by PCR: NEGATIVE
SARS Coronavirus 2 by RT PCR: NEGATIVE

## 2023-10-05 LAB — LIPASE, BLOOD: Lipase: 24 U/L (ref 11–51)

## 2023-10-05 MED ORDER — ONDANSETRON 4 MG PO TBDP: 4.0000 mg | ORAL_TABLET | Freq: Once | ORAL | Status: DC | PRN

## 2023-10-05 MED ORDER — ONDANSETRON HCL 4 MG PO TABS: 4.0000 mg | ORAL_TABLET | Freq: Three times a day (TID) | ORAL | 0 refills | Status: AC | PRN

## 2023-10-05 MED ORDER — ONDANSETRON HCL 4 MG/2ML IJ SOLN
4.0000 mg | Freq: Once | INTRAMUSCULAR | Status: AC
  Administered 2023-10-05: 4 mg via INTRAVENOUS
  Filled 2023-10-05: qty 2

## 2023-10-05 MED ORDER — ONDANSETRON HCL 4 MG/2ML IJ SOLN
4.0000 mg | Freq: Once | INTRAMUSCULAR | Status: AC | PRN
  Administered 2023-10-05: 4 mg via INTRAVENOUS
  Filled 2023-10-05: qty 2

## 2023-10-05 NOTE — Discharge Instructions (Addendum)
 You may take Zofran as needed for your nausea.  Stay hydrated drink small amount of fluids throughout the day until symptoms improved.

## 2023-10-05 NOTE — ED Triage Notes (Signed)
 Patient presents due to abdominal pain the started 0500 today accompanied with nausea, vomiting and diarrhea. He isn't sure if this may have been caused by something he ate.             EMS vitals:             150/80 BP      68 P       14 RR       99% SPO2 on room air    98.5 Temp

## 2023-10-05 NOTE — ED Provider Notes (Signed)
 [place] EMERGENCY DEPARTMENT AT [facility] Provider Note   CSN: [id number] Arrival date & time: 10/05/23  1252     History  Chief Complaint  Patient presents with   Abdominal Pain   Diarrhea   Emesis    George Coleman is a 38 y.o. male.  The history is provided by the patient and medical records. No language interpreter was used.  Abdominal Pain Associated symptoms: diarrhea and vomiting   Diarrhea Associated symptoms: abdominal pain and vomiting   Emesis Associated symptoms: abdominal pain and diarrhea      38 year old male history of asthma presenting with complaint for abdominal pain.  Patient report last night he went out to eat and a few hours later he developed abdominal cramping follows with nausea vomiting diarrhea.  Symptoms close persist throughout the night and this morning prompting the ER visit.  After receiving antinausea medication in the ER patient states he feels 90% better.  At this time he denies any significant abdominal discomfort denies any fever no chest pain shortness of breath productive cough or dysuria.  He felt he may have had a GI bug.  Home Medications Prior to Admission medications   Medication Sig Start Date End Date Taking? Authorizing Provider  albuterol (PROVENTIL HFA;VENTOLIN HFA) 108 (90 Base) MCG/ACT inhaler Inhale 2 puffs into the lungs every 6 (six) hours as needed for wheezing or shortness of breath. 11/28/17   Wieters, Hallie C, PA-C  fluticasone (FLONASE) 50 MCG/ACT nasal spray Place 2 sprays into both nostrils daily. Patient taking differently: Place 2 sprays into both nostrils daily as needed for allergies. 11/13/13   Rodolph Bong, MD  hydrOXYzine (ATARAX) 25 MG tablet Take 0.5-1 tablets (12.5-25 mg total) by mouth every 8 (eight) hours as needed for itching. 04/18/22   Wallis Bamberg, PA-C  predniSONE (DELTASONE) 20 MG tablet Take 2 tablets daily with breakfast. 04/18/22   Wallis Bamberg, PA-C      Allergies    Shellfish  allergy    Review of Systems   Review of Systems  Gastrointestinal:  Positive for abdominal pain, diarrhea and vomiting.  All other systems reviewed and are negative.   Physical Exam Updated Vital Signs BP 133/75 (BP Location: Left Arm)   Pulse 76   Temp 98.8 F (37.1 C) (Oral)   Resp 16   SpO2 99%  Physical Exam Constitutional:      General: He is not in acute distress.    Appearance: He is well-developed.  HENT:     Head: Atraumatic.     Mouth/Throat:     Mouth: Mucous membranes are moist.  Eyes:     Conjunctiva/sclera: Conjunctivae normal.  Cardiovascular:     Rate and Rhythm: Normal rate and regular rhythm.     Heart sounds: Normal heart sounds.  Pulmonary:     Breath sounds: No wheezing, rhonchi or rales.  Abdominal:     General: Bowel sounds are normal.     Palpations: Abdomen is soft.     Tenderness: There is no abdominal tenderness.  Musculoskeletal:     Cervical back: Normal range of motion and neck supple.  Skin:    Findings: No rash.  Neurological:     Mental Status: He is alert.     ED Results / Procedures / Treatments   Labs (all labs ordered are listed, but only abnormal results are displayed) Labs Reviewed  COMPREHENSIVE METABOLIC PANEL - Abnormal; Notable for the following components:  Result Value   Glucose, Bld 120 (*)    AST 45 (*)    ALT 59 (*)    All other components within normal limits  RESP PANEL BY RT-PCR (RSV, FLU A&B, COVID)  RVPGX2  LIPASE, BLOOD  CBC  URINALYSIS, ROUTINE W REFLEX MICROSCOPIC    EKG None  Radiology No results found.  Procedures Procedures    Medications Ordered in ED Medications  ondansetron (ZOFRAN) injection 4 mg (4 mg Intravenous Given 10/05/23 1311)  ondansetron (ZOFRAN) injection 4 mg (4 mg Intravenous Given 10/05/23 1527)    ED Course/ Medical Decision Making/ A&P                                 Medical Decision Making Amount and/or Complexity of Data Reviewed Labs:  ordered.  Risk Prescription drug management.   BP 133/75 (BP Location: Left Arm)   Pulse 76   Temp 98.8 F (37.1 C) (Oral)   Resp 16   SpO2 99%   35:23 PM   38 year old male history of asthma presenting with complaint for abdominal pain.  Patient report last night he went out to eat and a few hours later he developed abdominal cramping follows with nausea vomiting diarrhea.  Symptoms close persist throughout the night and this morning prompting the ER visit.  After receiving antinausea medication in the ER patient states he feels 90% better.  At this time he denies any significant abdominal discomfort denies any fever no chest pain shortness of breath productive cough or dysuria.  He felt he may have had a GI bug.  Exam overall reassuring, abdomen is soft nontender bowel sounds present.  Patient overall well-appearing.  Vital signs without any fever no hypoxia no hypotension.  -Labs ordered, independently viewed and interpreted by me.  Labs remarkable for reassuring labs -The patient was maintained on a cardiac monitor.  I personally viewed and interpreted the cardiac monitored which showed an underlying rhythm of: Normal sinus rhythm -Imaging including abdominal pelvis CT scan considered however abdomen is soft nontender have low suspicion for acute abdominal pathology -This patient presents to the ED for concern of abdominal pain, this involves an extensive number of treatment options, and is a complaint that carries with it a high risk of complications and morbidity.  The differential diagnosis includes viral gastroenteritis, colitis, appendicitis, cholecystitis, pancreatitis, UTI,  -Co morbidities that complicate the patient evaluation includes none -Treatment includes Zofran -Reevaluation of the patient after these medicines showed that the patient improved -PCP office notes or outside notes reviewed -Escalation to admission/observation considered: patients feels much better, is  comfortable with discharge, and will follow up with PCP -Prescription medication considered, patient comfortable with zofran -Social Determinant of Health considered         Final Clinical Impression(s) / ED Diagnoses Final diagnoses:  Nausea vomiting and diarrhea    Rx / DC Orders ED Discharge Orders          Ordered    ondansetron (ZOFRAN) 4 MG tablet  Every 8 hours PRN        10/05/23 1529              Fayrene Helper, PA-C 10/05/23 1530    Trifan, Kermit Balo, MD 10/05/23 1534
